# Patient Record
Sex: Female | Born: 1990 | Race: White | Hispanic: No | Marital: Single | Smoking: Former smoker
Health system: Southern US, Community
[De-identification: ages and names within clinical notes are randomized; demographics above are authoritative.]

## PROBLEM LIST (undated history)

## (undated) DIAGNOSIS — R51 Headache: Secondary | ICD-10-CM

## (undated) DIAGNOSIS — N200 Calculus of kidney: Secondary | ICD-10-CM

## (undated) DIAGNOSIS — J45909 Unspecified asthma, uncomplicated: Secondary | ICD-10-CM

## (undated) DIAGNOSIS — A499 Bacterial infection, unspecified: Secondary | ICD-10-CM

## (undated) DIAGNOSIS — O99340 Other mental disorders complicating pregnancy, unspecified trimester: Secondary | ICD-10-CM

## (undated) DIAGNOSIS — F319 Bipolar disorder, unspecified: Secondary | ICD-10-CM

## (undated) DIAGNOSIS — R519 Headache, unspecified: Secondary | ICD-10-CM

## (undated) DIAGNOSIS — K429 Umbilical hernia without obstruction or gangrene: Secondary | ICD-10-CM

## (undated) HISTORY — DX: Bacterial infection, unspecified: A49.9

## (undated) HISTORY — DX: Calculus of kidney: N20.0

## (undated) HISTORY — DX: Headache, unspecified: R51.9

## (undated) HISTORY — PX: HERNIA REPAIR: SHX51

## (undated) HISTORY — DX: Bipolar disorder, unspecified: F31.9

## (undated) HISTORY — DX: Headache: R51

## (undated) HISTORY — PX: WISDOM TOOTH EXTRACTION: SHX21

## (undated) HISTORY — PX: DILATION AND EVACUATION: SHX1459

## (undated) HISTORY — DX: Umbilical hernia without obstruction or gangrene: K42.9

## (undated) HISTORY — DX: Other mental disorders complicating pregnancy, unspecified trimester: O99.340

---
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
## 2009-07-03 ENCOUNTER — Ambulatory Visit (HOSPITAL_COMMUNITY): Admission: RE | Admit: 2009-07-03 | Discharge: 2009-07-03 | Payer: Self-pay | Admitting: Family Medicine

## 2009-07-24 ENCOUNTER — Ambulatory Visit (HOSPITAL_COMMUNITY): Admission: RE | Admit: 2009-07-24 | Discharge: 2009-07-24 | Payer: Self-pay | Admitting: Family Medicine

## 2009-08-01 ENCOUNTER — Ambulatory Visit (HOSPITAL_COMMUNITY): Admission: RE | Admit: 2009-08-01 | Discharge: 2009-08-01 | Payer: Self-pay | Admitting: Family Medicine

## 2009-08-08 ENCOUNTER — Ambulatory Visit (HOSPITAL_COMMUNITY): Admission: RE | Admit: 2009-08-08 | Discharge: 2009-08-08 | Payer: Self-pay | Admitting: Family Medicine

## 2009-09-05 ENCOUNTER — Ambulatory Visit (HOSPITAL_COMMUNITY): Admission: RE | Admit: 2009-09-05 | Discharge: 2009-09-05 | Payer: Self-pay | Admitting: Family Medicine

## 2009-10-09 ENCOUNTER — Ambulatory Visit (HOSPITAL_COMMUNITY): Admission: RE | Admit: 2009-10-09 | Discharge: 2009-10-09 | Payer: Self-pay | Admitting: Family Medicine

## 2009-11-06 ENCOUNTER — Ambulatory Visit (HOSPITAL_COMMUNITY): Admission: RE | Admit: 2009-11-06 | Discharge: 2009-11-06 | Payer: Self-pay | Admitting: Family Medicine

## 2009-12-04 ENCOUNTER — Ambulatory Visit (HOSPITAL_COMMUNITY)
Admission: RE | Admit: 2009-12-04 | Discharge: 2009-12-04 | Payer: Self-pay | Source: Home / Self Care | Admitting: Obstetrics and Gynecology

## 2010-01-06 ENCOUNTER — Inpatient Hospital Stay (HOSPITAL_COMMUNITY)
Admission: AD | Admit: 2010-01-06 | Discharge: 2010-01-06 | Payer: Self-pay | Source: Home / Self Care | Attending: Obstetrics and Gynecology | Admitting: Obstetrics and Gynecology

## 2010-01-07 ENCOUNTER — Inpatient Hospital Stay (HOSPITAL_COMMUNITY)
Admission: AD | Admit: 2010-01-07 | Discharge: 2010-01-09 | Payer: Self-pay | Source: Home / Self Care | Attending: Obstetrics and Gynecology | Admitting: Obstetrics and Gynecology

## 2010-01-07 ENCOUNTER — Encounter (INDEPENDENT_AMBULATORY_CARE_PROVIDER_SITE_OTHER): Payer: Self-pay | Admitting: Obstetrics and Gynecology

## 2010-01-09 LAB — URINALYSIS, MICROSCOPIC ONLY
Bilirubin Urine: NEGATIVE
Ketones, ur: NEGATIVE mg/dL
Nitrite: NEGATIVE
Protein, ur: NEGATIVE mg/dL
Specific Gravity, Urine: 1.02 (ref 1.005–1.030)
Urine Glucose, Fasting: NEGATIVE mg/dL
Urobilinogen, UA: 0.2 mg/dL (ref 0.0–1.0)
pH: 5.5 (ref 5.0–8.0)

## 2010-01-10 LAB — URINE CULTURE
Colony Count: 6000
Culture  Setup Time: 201201041735
Special Requests: NEGATIVE

## 2010-01-27 ENCOUNTER — Encounter: Payer: Self-pay | Admitting: Family Medicine

## 2010-02-01 ENCOUNTER — Inpatient Hospital Stay (HOSPITAL_COMMUNITY)
Admission: AD | Admit: 2010-02-01 | Discharge: 2010-02-01 | Payer: Self-pay | Source: Home / Self Care | Attending: Obstetrics and Gynecology | Admitting: Obstetrics and Gynecology

## 2010-02-01 LAB — DIFFERENTIAL
Basophils Absolute: 0 10*3/uL (ref 0.0–0.1)
Basophils Relative: 0 % (ref 0–1)
Eosinophils Absolute: 0.1 10*3/uL (ref 0.0–0.7)
Eosinophils Relative: 2 % (ref 0–5)
Lymphocytes Relative: 37 % (ref 12–46)
Lymphs Abs: 3.2 10*3/uL (ref 0.7–4.0)
Monocytes Absolute: 0.5 10*3/uL (ref 0.1–1.0)
Monocytes Relative: 5 % (ref 3–12)
Neutro Abs: 4.9 10*3/uL (ref 1.7–7.7)
Neutrophils Relative %: 56 % (ref 43–77)

## 2010-02-01 LAB — CBC
HCT: 33.2 % — ABNORMAL LOW (ref 36.0–46.0)
Hemoglobin: 11.2 g/dL — ABNORMAL LOW (ref 12.0–15.0)
MCH: 28.6 pg (ref 26.0–34.0)
MCHC: 33.7 g/dL (ref 30.0–36.0)
MCV: 84.9 fL (ref 78.0–100.0)
Platelets: 368 10*3/uL (ref 150–400)
RBC: 3.91 MIL/uL (ref 3.87–5.11)
RDW: 12 % (ref 11.5–15.5)
WBC: 8.7 10*3/uL (ref 4.0–10.5)

## 2010-02-01 LAB — COMPREHENSIVE METABOLIC PANEL
ALT: 18 U/L (ref 0–35)
AST: 17 U/L (ref 0–37)
Albumin: 3.9 g/dL (ref 3.5–5.2)
Alkaline Phosphatase: 80 U/L (ref 39–117)
BUN: 14 mg/dL (ref 6–23)
CO2: 28 mEq/L (ref 19–32)
Calcium: 9.6 mg/dL (ref 8.4–10.5)
Chloride: 105 mEq/L (ref 96–112)
Creatinine, Ser: 0.63 mg/dL (ref 0.4–1.2)
GFR calc Af Amer: >60 mL/min (ref >60)
GFR calc non Af Amer: >60 mL/min (ref >60)
Glucose, Bld: 93 mg/dL (ref 70–99)
Potassium: 4.2 mEq/L (ref 3.5–5.1)
Sodium: 139 mEq/L (ref 135–145)
Total Bilirubin: 0.3 mg/dL (ref 0.3–1.2)
Total Protein: 6.4 g/dL (ref 6.0–8.3)

## 2010-02-01 LAB — URINALYSIS, ROUTINE W REFLEX MICROSCOPIC
Bilirubin Urine: NEGATIVE
Ketones, ur: NEGATIVE mg/dL
Leukocytes, UA: NEGATIVE
Nitrite: NEGATIVE
Protein, ur: NEGATIVE mg/dL
Specific Gravity, Urine: >1.03 — ABNORMAL HIGH (ref 1.005–1.030)
Urine Glucose, Fasting: NEGATIVE mg/dL
Urobilinogen, UA: 0.2 mg/dL (ref 0.0–1.0)
pH: 6 (ref 5.0–8.0)

## 2010-02-01 LAB — URINE MICROSCOPIC-ADD ON

## 2010-02-01 LAB — HCG, QUANTITATIVE, PREGNANCY: hCG, Beta Chain, Quant, S: 4 m[IU]/mL (ref <5)

## 2010-02-02 LAB — GC/CHLAMYDIA PROBE AMP, GENITAL
Chlamydia, DNA Probe: NEGATIVE
GC Probe Amp, Genital: NEGATIVE

## 2010-02-03 LAB — URINE CULTURE
Colony Count: NO GROWTH
Culture  Setup Time: 201201280122

## 2010-02-07 ENCOUNTER — Other Ambulatory Visit: Payer: Self-pay | Admitting: Obstetrics and Gynecology

## 2010-02-07 ENCOUNTER — Ambulatory Visit (HOSPITAL_COMMUNITY)
Admission: RE | Admit: 2010-02-07 | Discharge: 2010-02-07 | Disposition: A | Discharge status: Home / Self Care | Payer: Medicaid Other | Source: Ambulatory Visit | Attending: Obstetrics and Gynecology | Admitting: Obstetrics and Gynecology

## 2010-02-07 DIAGNOSIS — N949 Unspecified condition associated with female genital organs and menstrual cycle: Secondary | ICD-10-CM | POA: Insufficient documentation

## 2010-02-07 DIAGNOSIS — N938 Other specified abnormal uterine and vaginal bleeding: Secondary | ICD-10-CM | POA: Insufficient documentation

## 2010-02-19 NOTE — Op Note (Signed)
[name] [name], HAEFNER NO.:  [id number]  MEDICAL RECORD NO.:  [id number]           PATIENT TYPE:  O  LOCATION:  [facility]                          FACILITY[facility]  [facility]  PHYSICIAN:  Janine Limbo, M.D.DATE OF BIRTH:  1990/12/11  DATE OF PROCEDURE:  02/07/2010 DATE OF DISCHARGE:                              OPERATIVE REPORT   PREOPERATIVE DIAGNOSES: 1. Heavy uterine bleeding. 2. Uterine cramping. 3. Retained products of conception.  POSTOPERATIVE DIAGNOSES: 1. Heavy uterine bleeding. 2. Uterine cramping. 3. Retained products of conception.  PROCEDURE:  Suction dilatation and evacuation.  SURGEON:  Janine Limbo, MD  FIRST ASSISTANT:  None.  ANESTHETIC:  General.  DISPOSITION:  [name] is a 20 year old female, now para 1-0-0-1, who presents with the above-mentioned diagnoses.  The patient had a vaginal delivery on January 07, 2010.  She has complained of heavy uterine bleeding and has been evaluated on multiple occasions.  She has been treated with Methergine on two separate occasions.  An ultrasound was performed that showed a 3-cm area within the uterine cavity that was suspicious for retained products of conception.  The patient also complains of crampy back pain.  The patient understands the indications for her surgical procedure and she accepts the risks of, but not limited to, anesthetic complications, bleeding, infections, and possible damage to the surrounding organs.  FINDINGS:  The patient's blood type is AB positive.  Her hemoglobin is 11.8.  The patient had a 14-week size uterus.  There was a small amount of products of conception within the uterine cavity.  No adnexal masses were appreciated.  PROCEDURE:  The patient was taken to the operating room where a general anesthetic was given.  The patient's lower abdomen, perineum, and vagina were prepped with multiple layers of Betadine.  The bladder was drained of urine.  The patient was  then sterilely draped.  Examination under anesthesia was performed.  A paracervical block was placed using 10 mL of 0.5% Marcaine.  The uterus sounded to 14-cm.  The cervix was gently dilated.  The uterine cavity was evacuated using a size 10 suction curette followed by medium sharp curette.  A moderate amount of blood clots and a small amount of products of conception were removed within the uterine cavity.  No other pathology was appreciated.  The cavity was felt to be clean at the end of our procedure.  Hemostasis was confirmed. The patient's exam was repeated and the uterus was noted to be firm. The patient was returned to the supine position.  She was awakened from her anesthetic without difficulty and then transported to the recovery room in stable condition.  The products of conception were sent to Pathology.  Sponge and needle counts were correct.  The estimated blood loss for the procedure was 20 mL.  FOLLOWUP INSTRUCTIONS:  The patient will return to see Dr. Stefano Gaul in 2 weeks for followup examination.  She was given a copy of thepostoperative instruction sheet as prepared by the [facility] for patient's who have undergone a dilatation and curettage. She will call for questions or concerns.  She was given a prescription  for: 1. Motrin 800 mg 1 tablet every 8 h. as needed for mild-to-moderate     pain. 2. Vicodin 1 or 2 tablets every 4 h. as needed for severe pain. 3. Phenergan 25 mg 1 tablet every 6 h. as needed for nausea.     Janine Limbo, M.D.     AVS/MEDQ  D:  02/07/2010  T:  02/08/2010  Job:  [id number]  Electronically Signed by Kirkland Hun M.D. on 02/12/2010 10:07:52 PM

## 2010-03-18 LAB — URIC ACID: Uric Acid, Serum: 7.7 mg/dL — ABNORMAL HIGH (ref 2.4–7.0)

## 2010-03-18 LAB — CBC
HCT: 32.7 % — ABNORMAL LOW (ref 36.0–46.0)
HCT: 32.7 % — ABNORMAL LOW (ref 36.0–46.0)
Hemoglobin: 11.7 g/dL — ABNORMAL LOW (ref 12.0–15.0)
MCH: 29.3 pg (ref 26.0–34.0)
MCV: 83 fL (ref 78.0–100.0)
MCV: 87.2 fL (ref 78.0–100.0)
Platelets: 220 10*3/uL (ref 150–400)
RBC: 3.75 MIL/uL — ABNORMAL LOW (ref 3.87–5.11)
RDW: 12.6 % (ref 11.5–15.5)
WBC: 14.5 10*3/uL — ABNORMAL HIGH (ref 4.0–10.5)

## 2010-03-18 LAB — COMPREHENSIVE METABOLIC PANEL
Alkaline Phosphatase: 104 U/L (ref 39–117)
BUN: 9 mg/dL (ref 6–23)
CO2: 27 mEq/L (ref 19–32)
Chloride: 101 mEq/L (ref 96–112)
Creatinine, Ser: 0.63 mg/dL (ref 0.4–1.2)
GFR calc non Af Amer: >60 mL/min (ref >60)
Glucose, Bld: 69 mg/dL — ABNORMAL LOW (ref 70–99)
Potassium: 3.8 mEq/L (ref 3.5–5.1)
Total Bilirubin: 0.6 mg/dL (ref 0.3–1.2)

## 2010-03-18 LAB — LACTATE DEHYDROGENASE: LDH: 164 U/L (ref 94–250)

## 2010-05-24 NOTE — Op Note (Signed)
[name] [name]            ACCOUNT NO.:  [id number]   MEDICAL RECORD NO.:  [id number]          PATIENT TYPE:  AMB   LOCATION:  [facility]                          FACILITY:  [facility]   PHYSICIAN:  Hewitt Blade, D.D.S.DATE OF BIRTH:  [birth year]   DATE OF PROCEDURE:  03/03/2004  DATE OF DISCHARGE:  02/28/2004                                 OPERATIVE REPORT   PREOPERATIVE DIAGNOSIS:  Maxillary and mandibular full bony impacted third  molar teeth, numbers 1, 16, 17, 32, full bony and aberrantly displaced  impacted maxillary canine tooth number 11, history of asthma, history of  anxiety disorders.   POSTOPERATIVE DIAGNOSIS:  Maxillary and mandibular full bony impacted third  molar teeth, numbers 1, 16, 17, 32, full bony and aberrantly displaced  impacted maxillary canine tooth number 11, history of asthma, history of  anxiety disorders.   OPERATION PERFORMED:  Removal of the above teeth.   SURGEON:  Hewitt Blade, D.D.S.   FIRST ASSISTANT:  Brewer.   ANESTHESIA:  General via oral endotracheal intubation.   ESTIMATED BLOOD LOSS:  Less than 25 mL.   FLUIDS REPLACED:  Approximately 1000 mL crystalloid solutions.   COMPLICATIONS:  None apparent.   INDICATIONS FOR PROCEDURE:  [name] [name] is a 20 year old who was referred to  my office by her orthodontist for evaluation and removal of the impacted  teeth.  The third molar teeth were routine fully bony impacted third molar  teeth.  The tooth #11 was located in an area that its removal would block  the patient's airway.  In view of the history of asthma, and anxiety  disorders, it was recommended that this procedure be performed in a hospital  setting under general anesthesia with intubation.   DESCRIPTION OF PROCEDURE:  On February 28, 2004, [name] [name] was taken to  [facility] where she was placed on the operating table in  a supine position.  Following successful oral endotracheal intubation  general  anesthesia, the patient's face, neck and oral cavity were prepped  and draped in the usual sterile operating room fashion.  The hypopharynx was  suctioned free of fluids and secretions, then a moistened two inch vaginal  pack was placed as a throat pack.  Attention was directed intraorally, where  approximately 12 mL of 0.5% Xylocaine containing 1:200,000 epinephrine were  infiltrated in the maxillary right and left posterior superior alveolar  nerve distributions, the right and left inferior alveolar nerve  distributions, and the anterior maxillary buccal and palatal soft tissues.  Attention was directed towards the right posterior mandibular arch, where a  #15 Bard-Parker blade was used to create a 1.5 curvilinear incision over the  alveolus of tooth #1.  A full thickness mucoperiosteal flap was elevated  laterally and superiorly using a #9 Molt periosteal elevator.  The overlying  cortical bone was identified and then reduced using a Stryker rotatory  osteotome with a #8 round bur.  The imbedded tooth was visualized and then  sectioned in its vertical axes using the Stryker rotatory osteotome and a  701 fissure bur.  The tooth was then subluxated and  removed from the  alveolus using an 11A elevator and curved Nicholaus Bloom hemostats.  The surrounding  dental follicular tissue was then curetted and removed using a double ended  Molt curette and removed.  The bony margins were then smoothed with a small  osseous file.  The surgical area was then thoroughly irrigated with sterile  saline irrigating solutions and suctioned.   In a similar fashion, the remaining third molar teeth were removed as well.  Attention was then directed towards the maxillary arch, where a full  thickness mucoperiosteal incision was made along the lingual aspects of  teeth numbers 8, 9, 10, 12, and 13.  A #9 Molt periosteal elevator was then  used to reflect a full thickness mucoperiosteal flap palatally.  The bone  over  imbedded tooth #11 was then reduced using a Stryker rotary osteotome  and a #8 round bur.  The tooth was then visualized and found to be displaced  between teeth numbers 10 and 12.  The tooth was then sectioned in its long  and vertical axes using the Stryker rotary osteotome and a 701 fissure bur.  The fragments of tooth were then subluxated and elevated from the palate  using an 11A elevator, a periosteal elevator, and removed using mosquito  hemostats.  The surrounding dental follicular tissue was curetted and  removed using the double ended Molt curette.  The area was then thoroughly  irrigated with sterile saline irrigating solutions and suctioned.  The  mucoperiosteal margins were proximally sutured in interrupted fashion in a  watertight manner closing the incision.  The oral cavity was then irrigated  and suctioned.  The throat pack was removed and the hypopharynx suctioned  free of fluids and secretions.  [name] [name] was then allowed to awaken from  the anesthesia and taken to the recovery room where she tolerated the  procedure well and without apparent complications.      DC/MEDQ  D:  03/03/2004  T:  03/04/2004  Job:  [id number]

## 2010-12-28 IMAGING — US US RENAL
1 series · 14 of 25 positions shown · non-contrast
Comparison: None.

CLINICAL DATA: Hematuria.  Nephrolithiasis.  17 weeks pregnant.

RENAL/URINARY TRACT ULTRASOUND COMPLETE

[Series 1: us renal · 0.26mm/px · 14 of 32 slices shown]
[im 1/32]
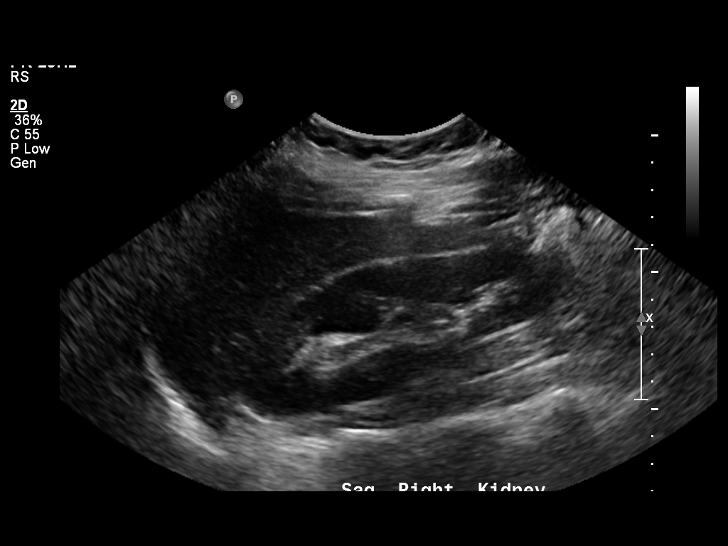
[im 3/32]
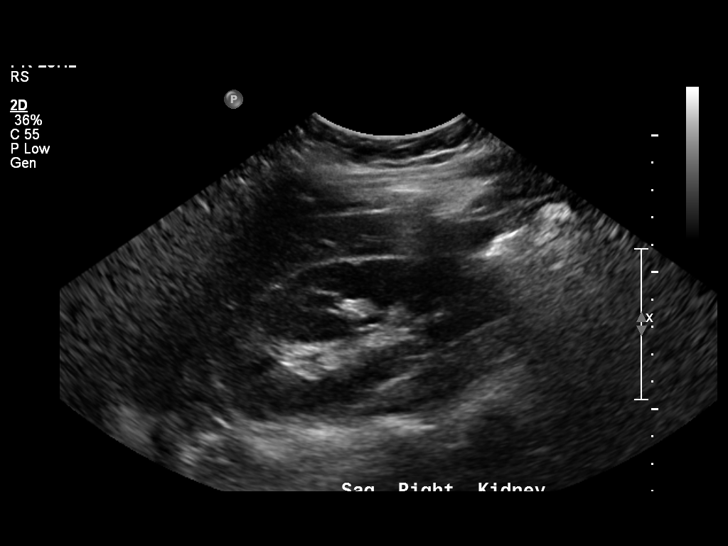
[im 6/32]
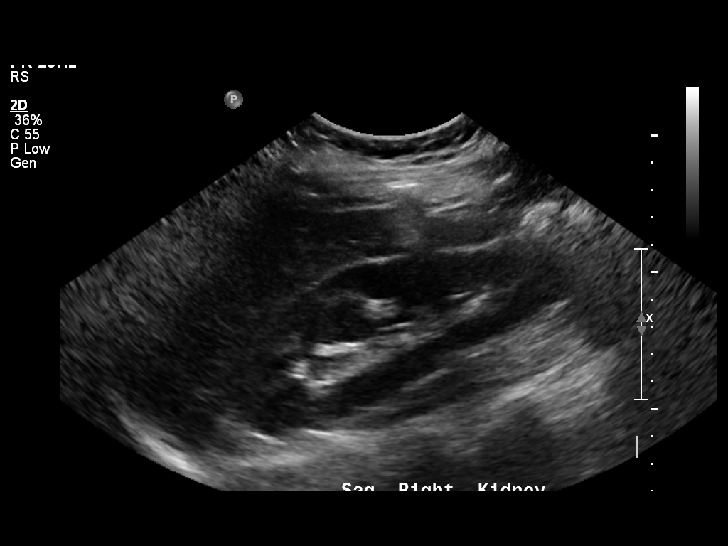
[im 8/32]
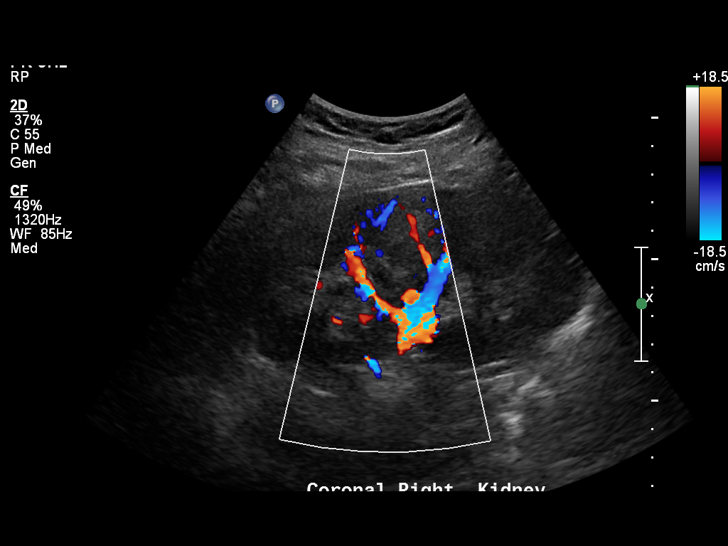
[im 11/32]
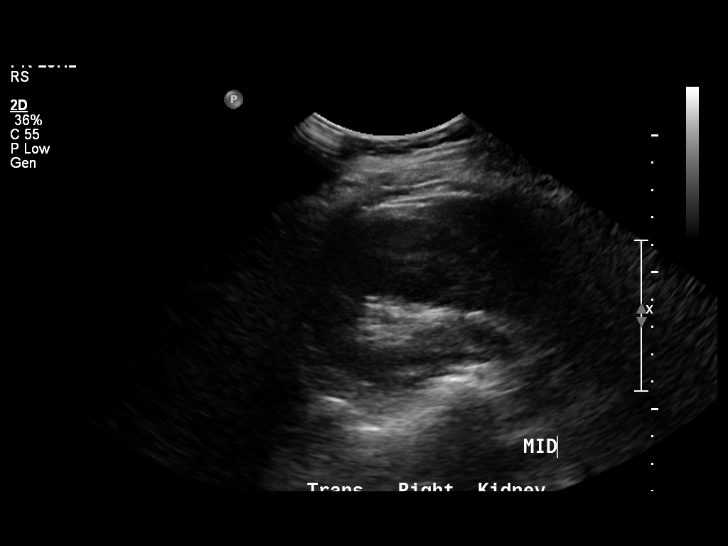
[im 12/32]
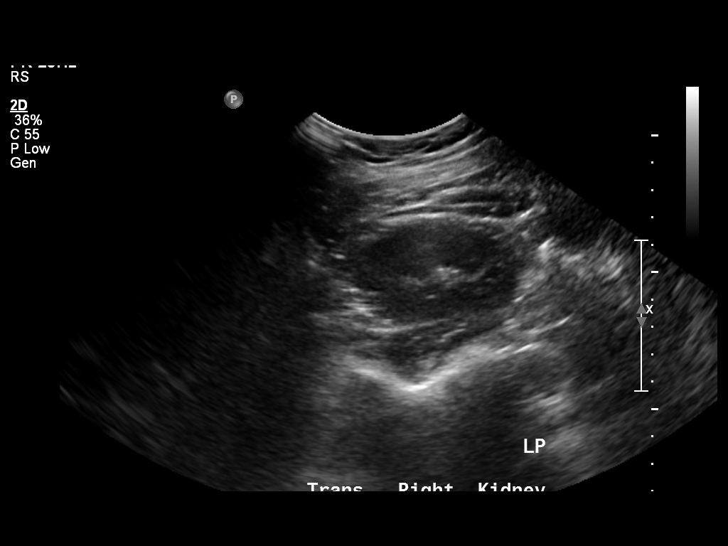
[im 15/32]
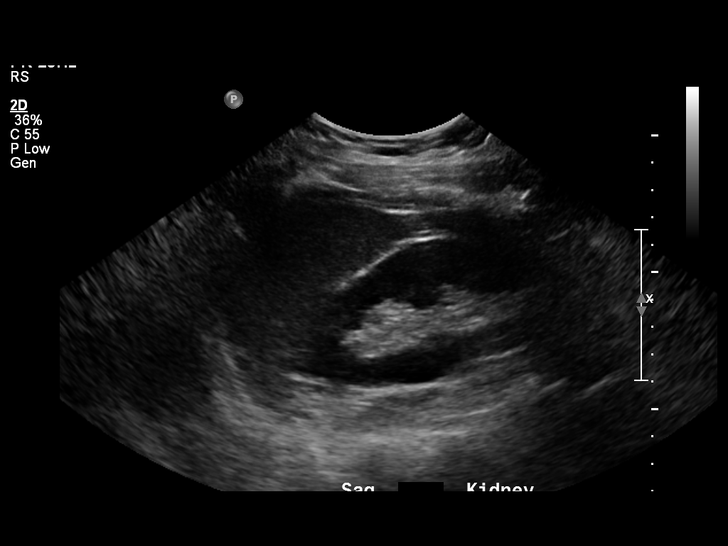
[im 17/32]
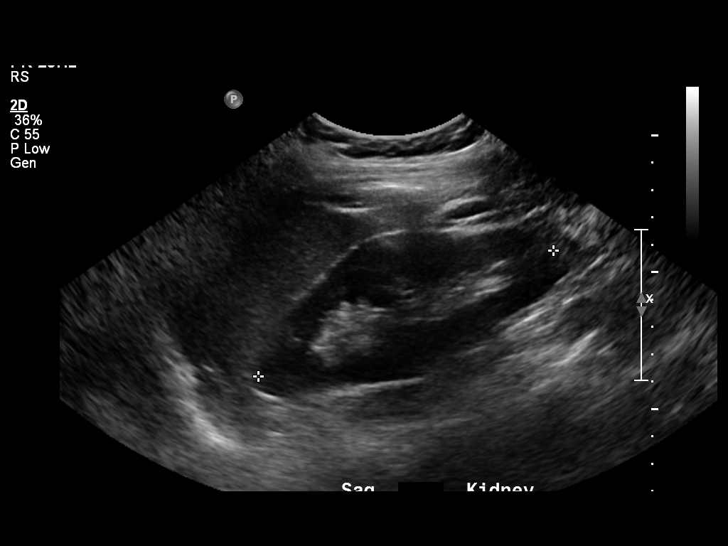
[im 20/32]
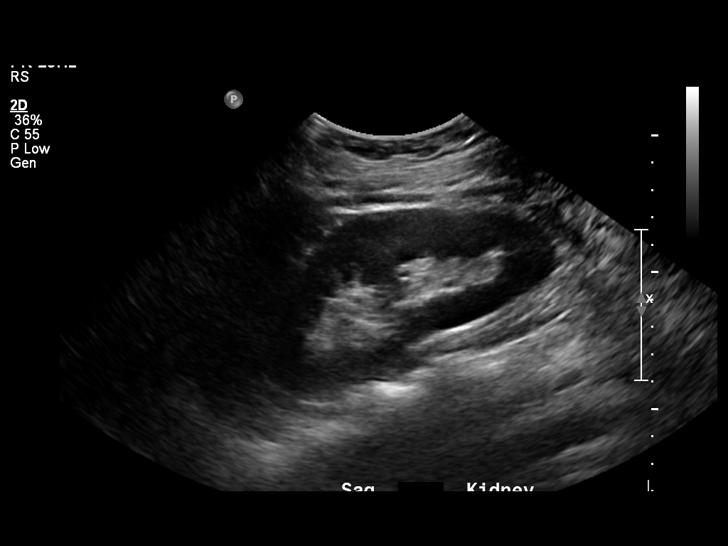
[im 21/32]
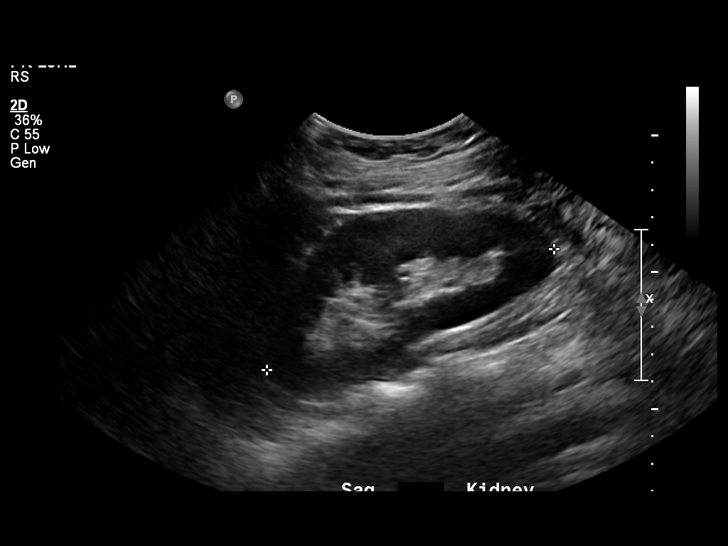
[im 24/32]
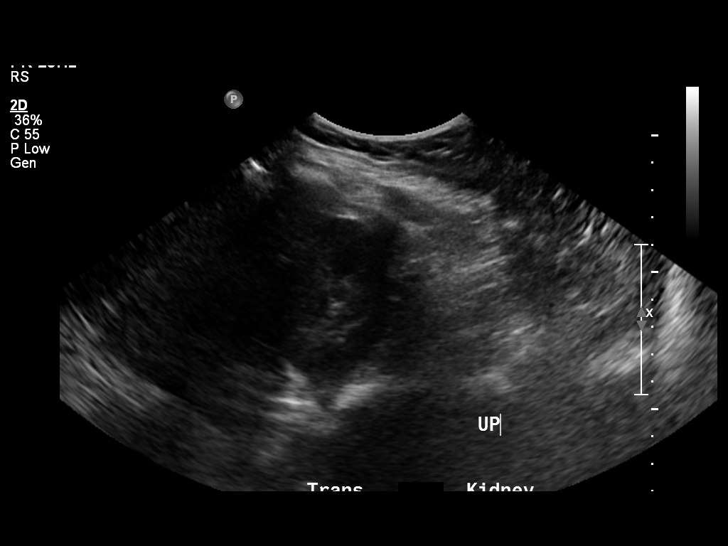
[im 26/32]
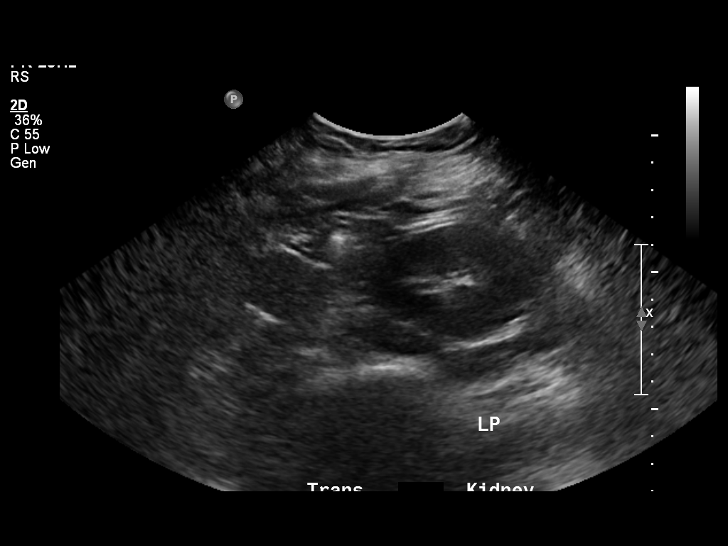
[im 29/32]
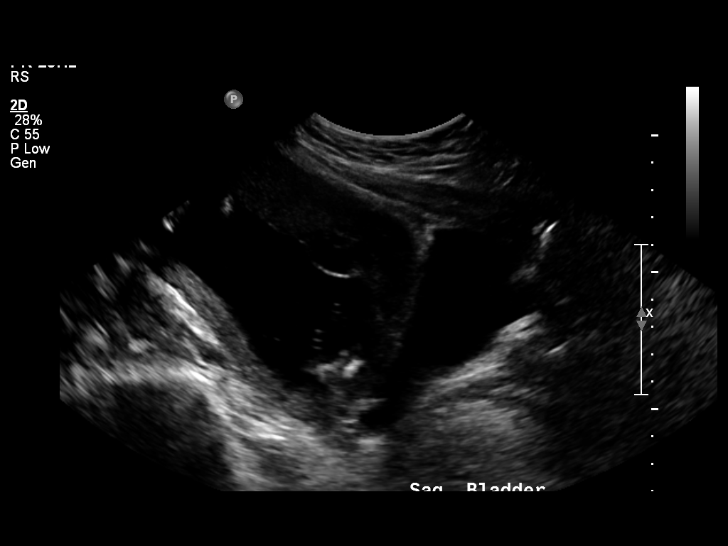
[im 32/32]
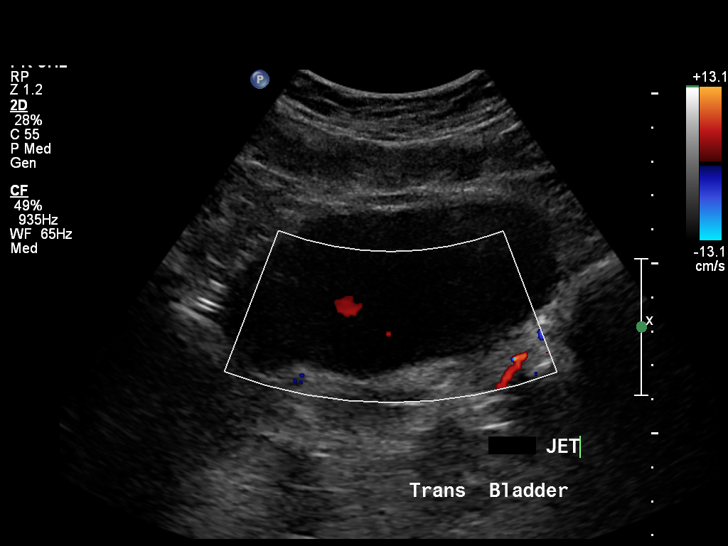

[14 of 25 positions shown; findings below may reference images not displayed]

FINDINGS: Right Kidney:  Normal in size and parenchymal echogenicity.  No
evidence of mass or hydronephrosis.  No definite renal calculi
visualized sonographically, although ultrasound sensitivity for
small calculi is decreased.

Left Kidney:  Normal in size and parenchymal echogenicity.  No
evidence of mass or hydronephrosis. No definite calculi are
visualized sonographically, although ultrasound sensitivity for
small calculi is decreased.

Bladder:  Appears normal for degree of bladder distention.
IMPRESSION: Normal study.  No evidence of renal mass or hydronephrosis.

## 2011-02-01 IMAGING — US US OB FOLLOW-UP
1 series · 18 of 28 positions shown · non-contrast
Comparison: none

OBSTETRICAL ULTRASOUND:
 This ultrasound was performed in The [HOSPITAL], and the [facility] report will be stored to [REDACTED] PACS.  This report is also available in [HOSPITAL]?s accessANYware.

[Series 1: us ob follow-up · 18 of 83 slices shown]
[im 1/83]
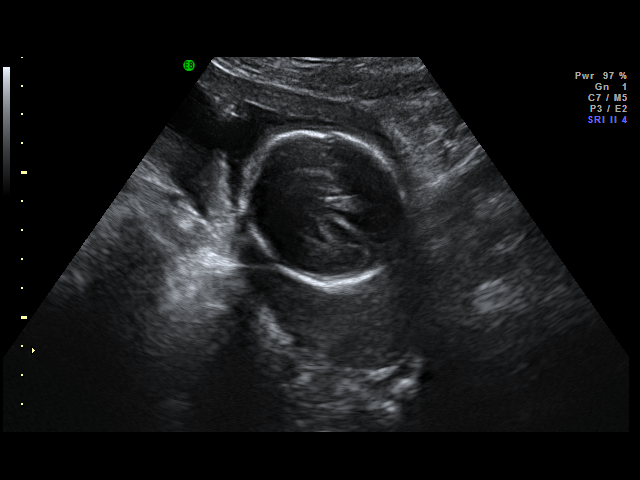
[im 7/83]
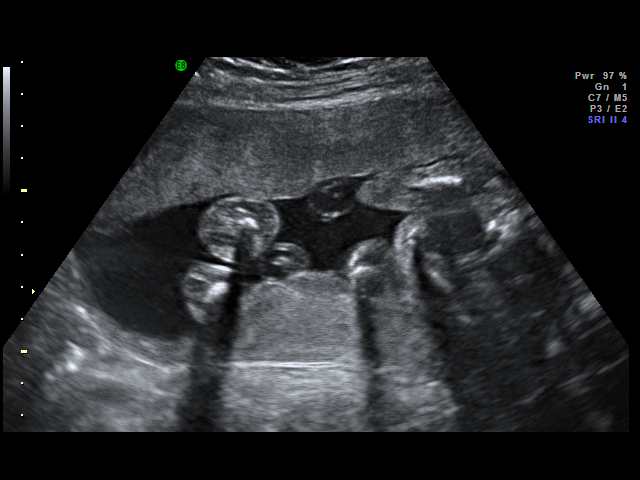
[im 10/83]
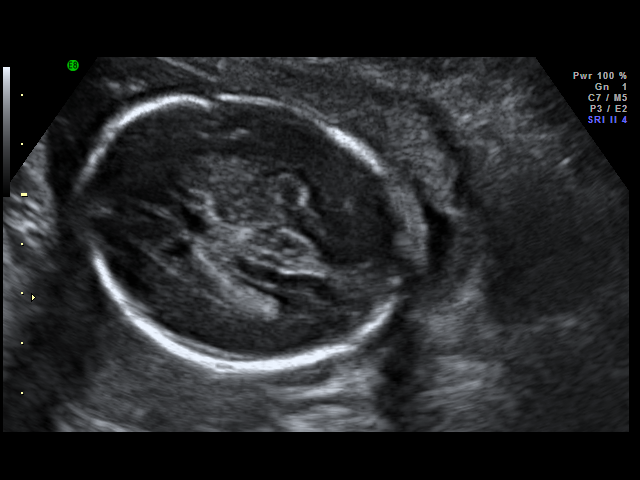
[im 16/83]
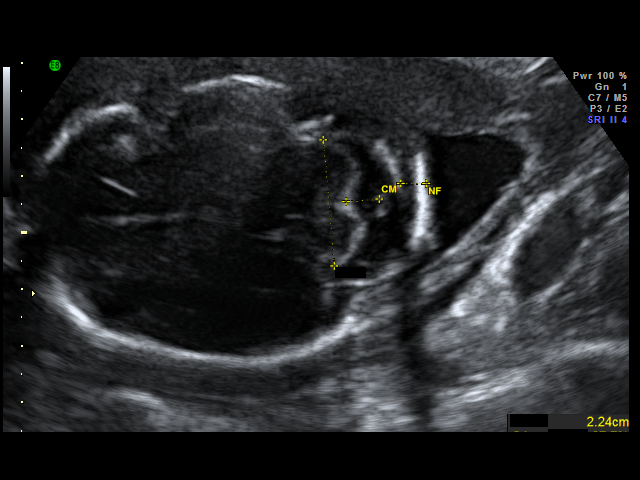
[im 22/83]
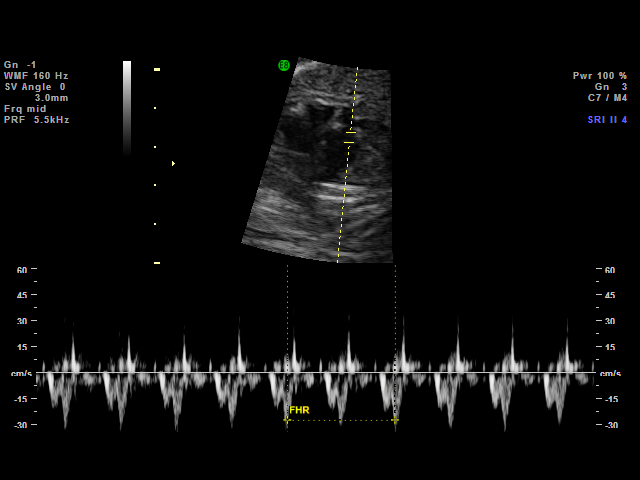
[im 25/83]
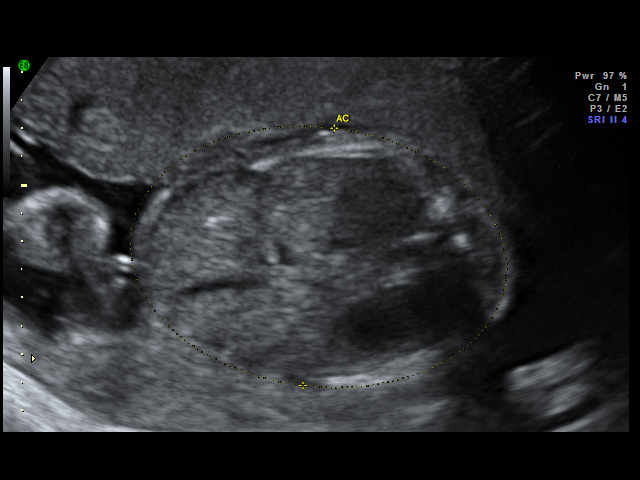
[im 31/83]
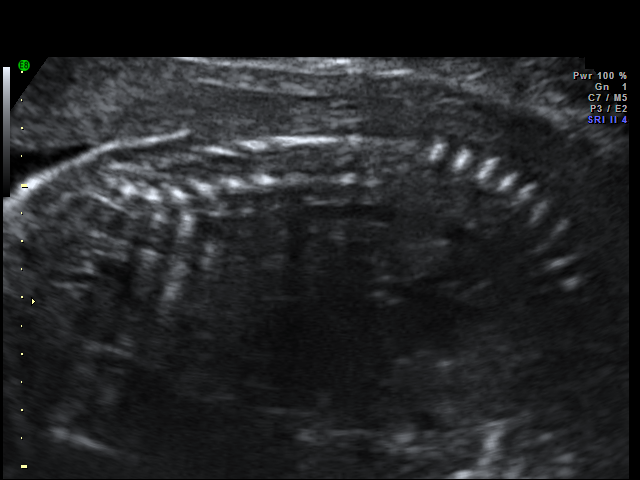
[im 34/83]
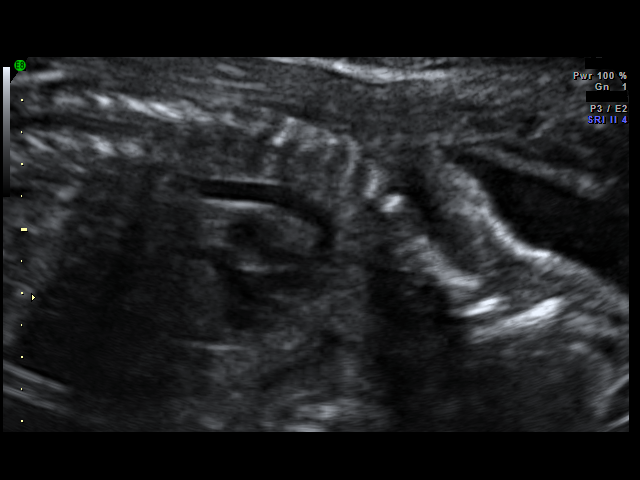
[im 40/83]
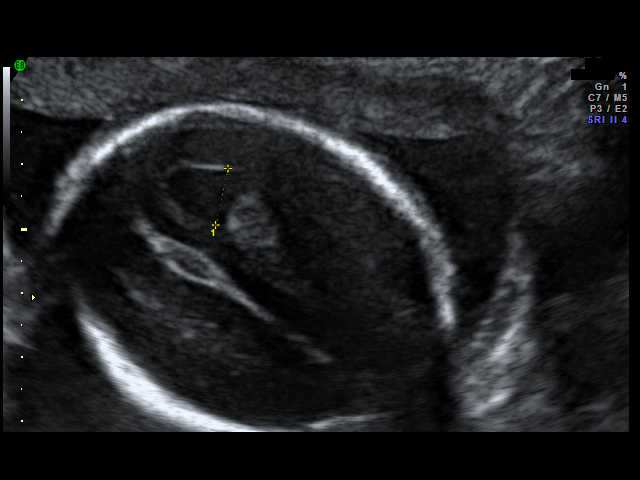
[im 43/83]
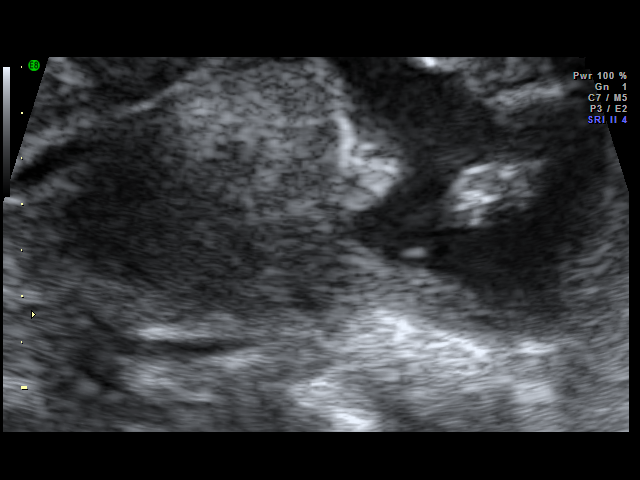
[im 49/83]
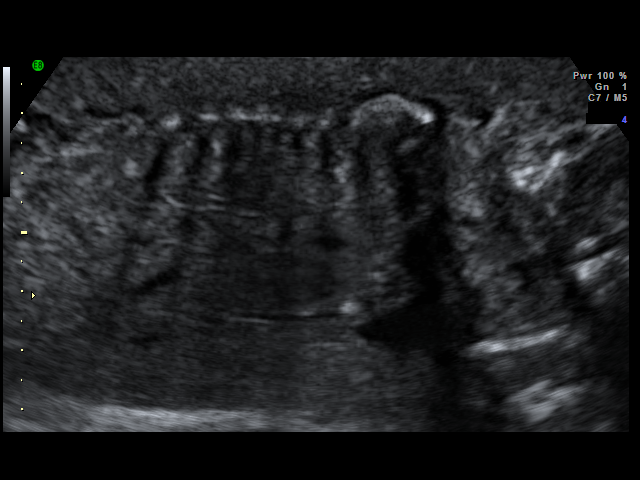
[im 52/83]
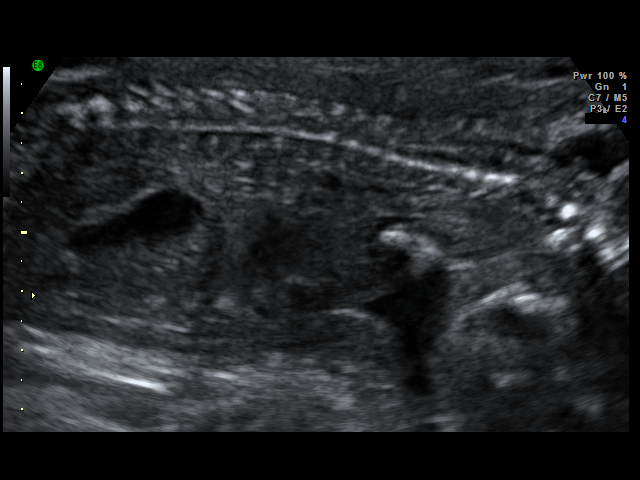
[im 58/83]
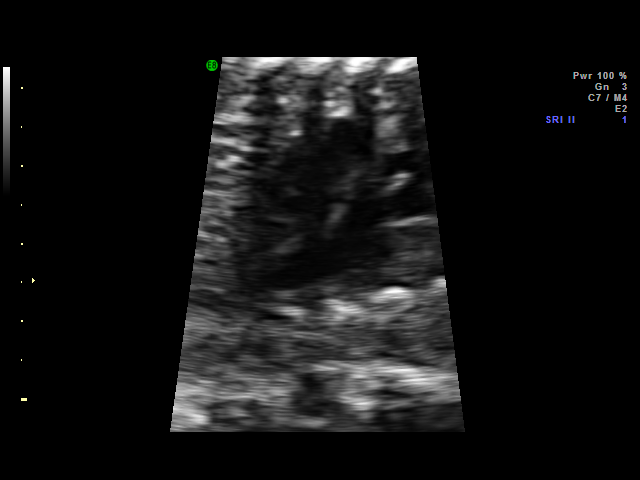
[im 64/83]
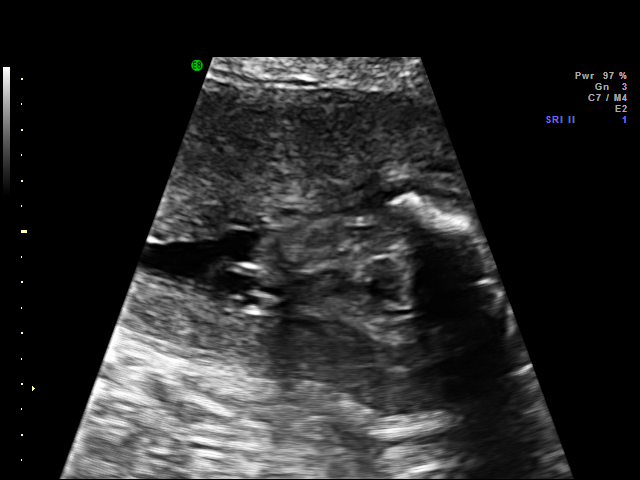
[im 67/83]
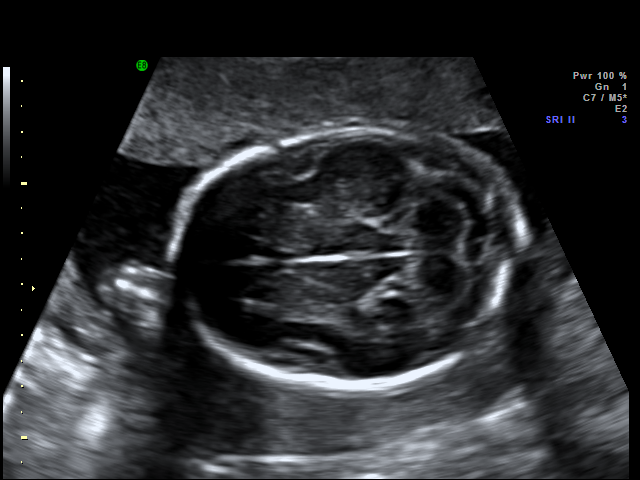
[im 73/83]
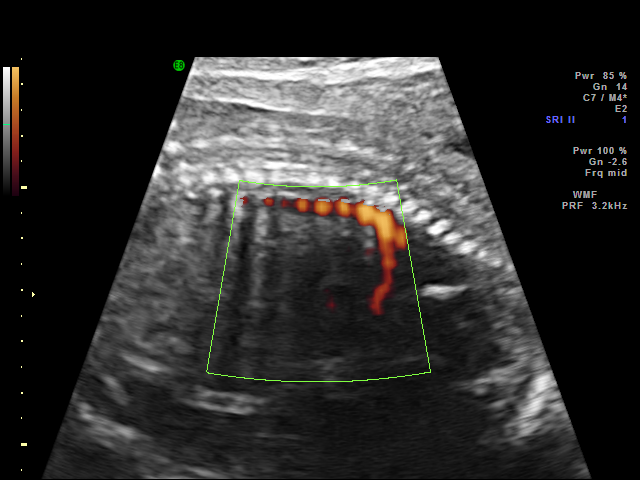
[im 76/83]
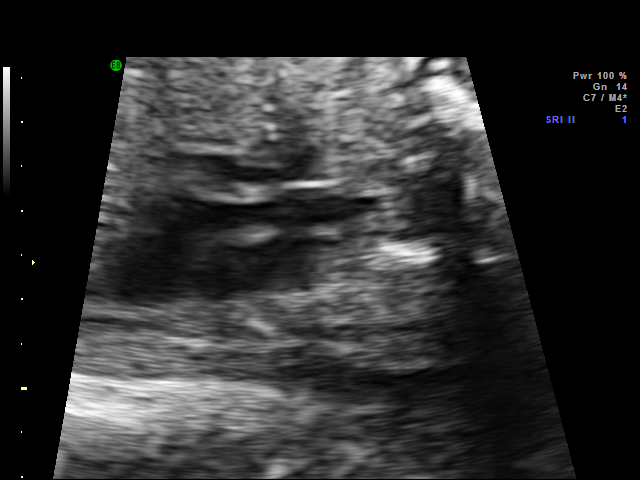
[im 83/83]
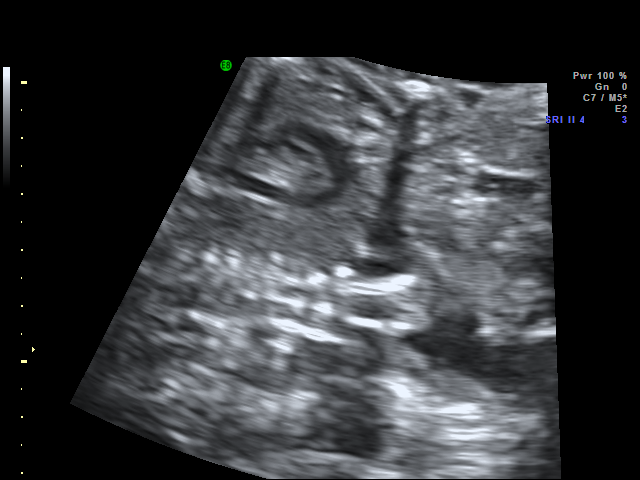

[18 of 28 positions shown; findings below may reference images not displayed]

IMPRESSION: [facility] has also been faxed to the ordering physician.

## 2011-06-30 IMAGING — US US PELVIS COMPLETE MODIFY
1 series · 14 of 25 positions shown · non-contrast
Comparison: None.

CLINICAL DATA: Abdominal pain and bleeding.



[Series 1: us pelvis complete · 14 of 46 slices shown]
[im 1/46]
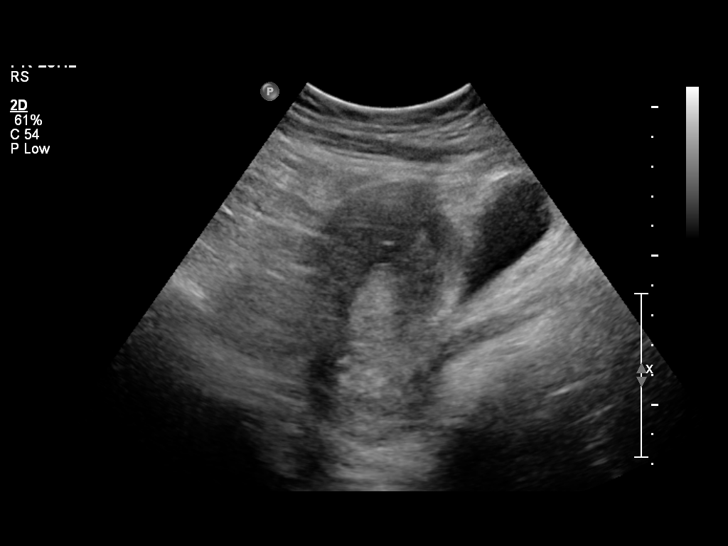
[im 4/46]
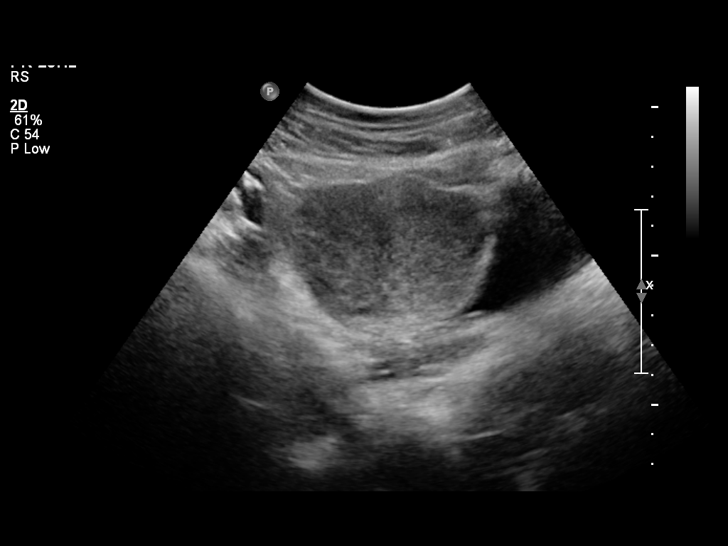
[im 8/46]
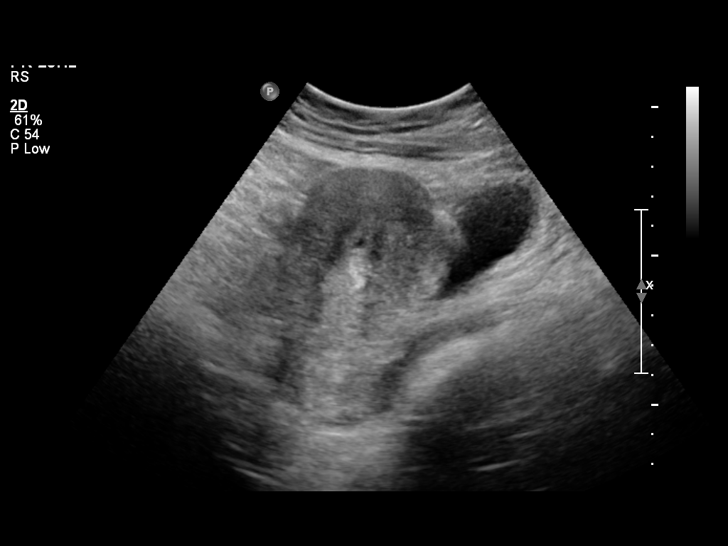
[im 12/46]
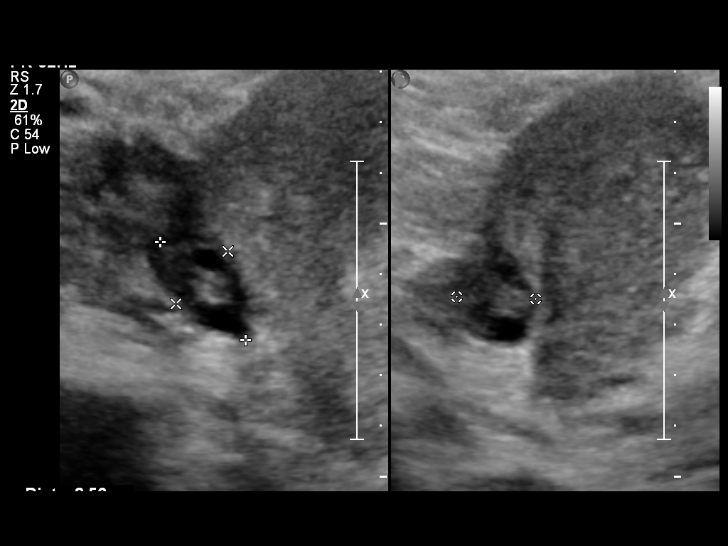
[im 16/46]
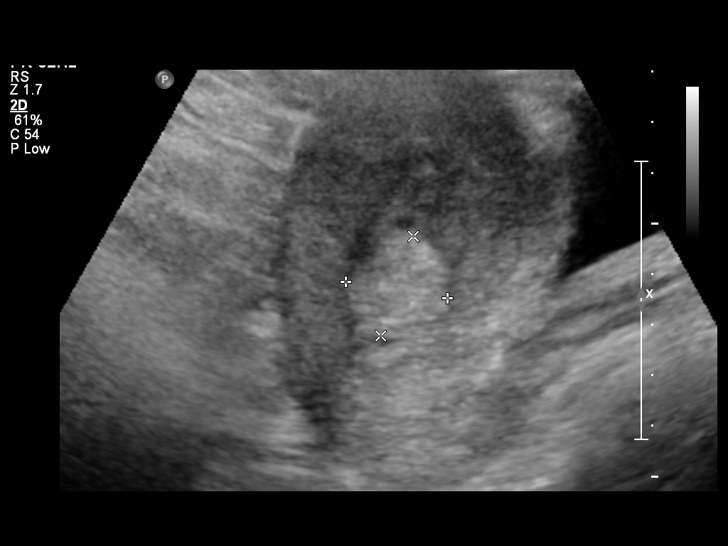
[im 17/46]
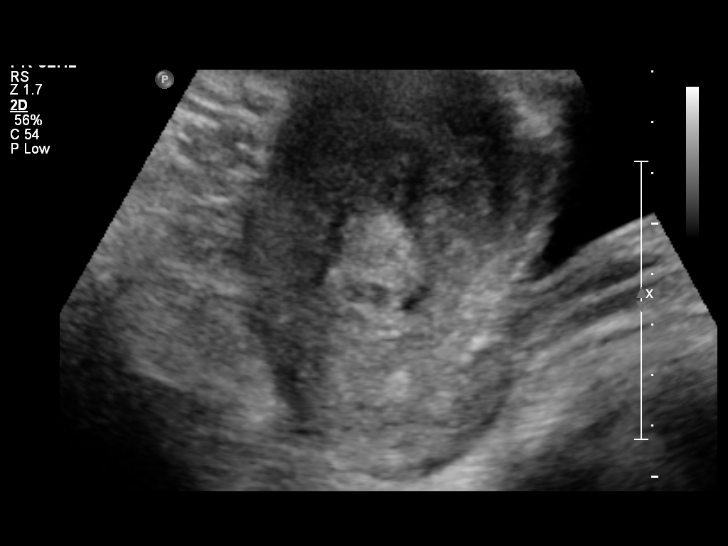
[im 21/46]
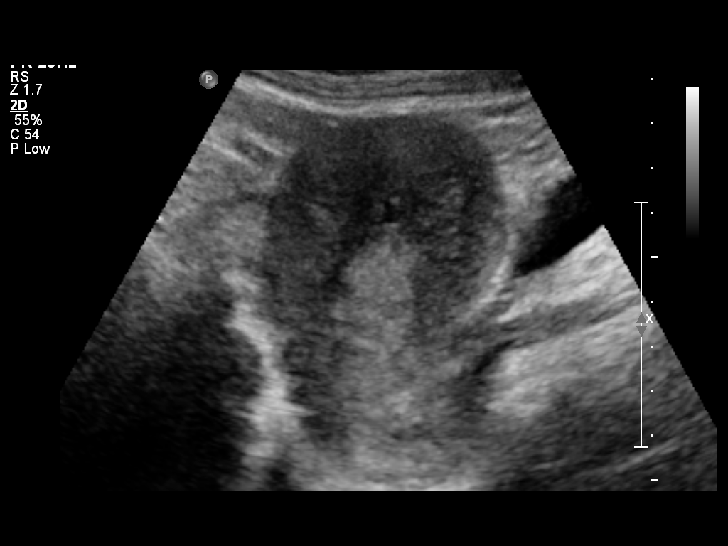
[im 25/46]
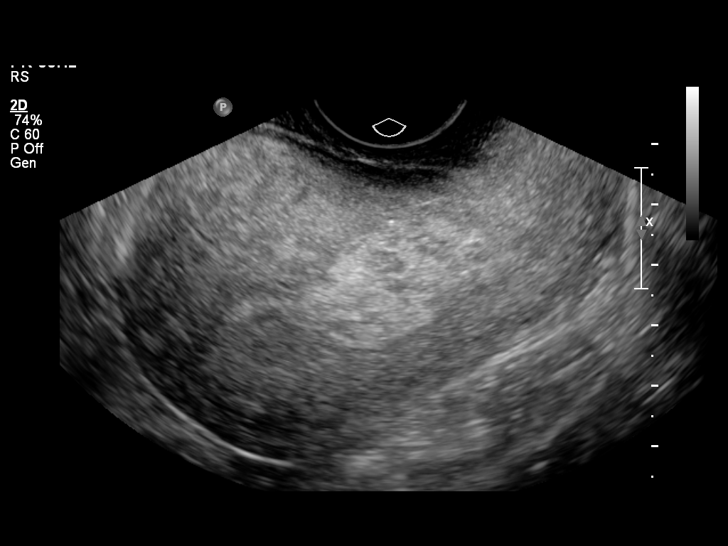
[im 29/46]
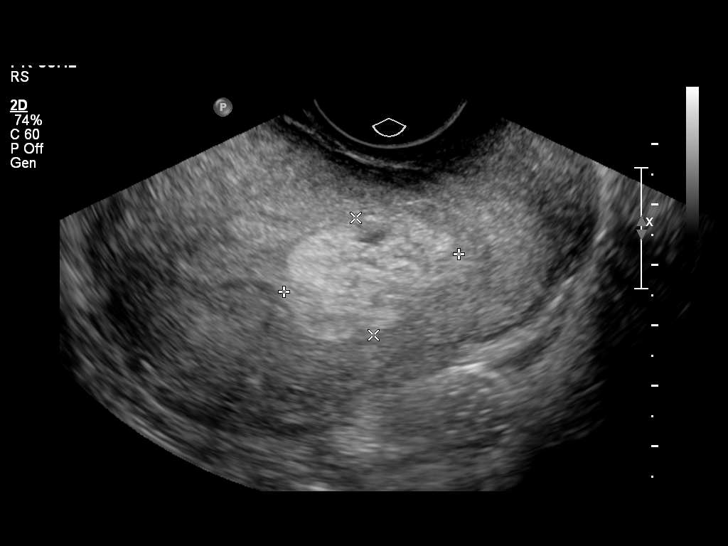
[im 31/46]
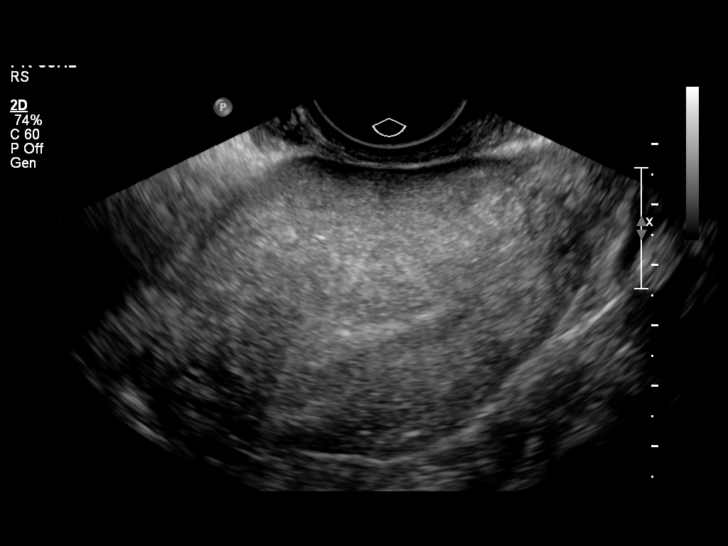
[im 34/46]
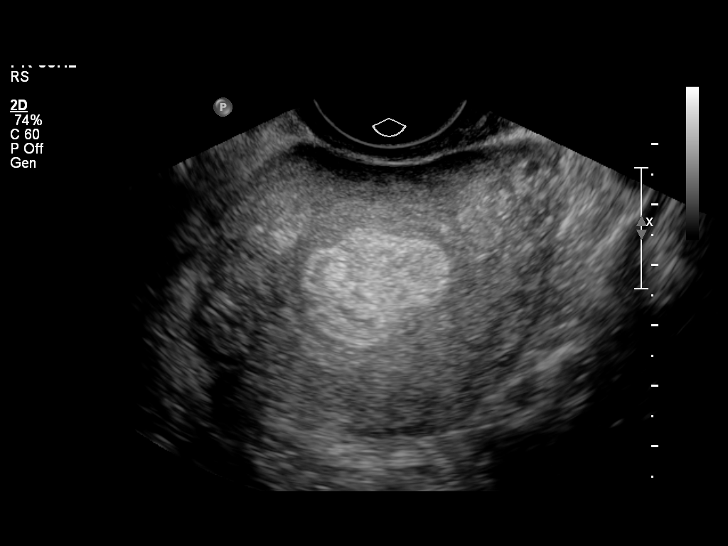
[im 38/46]
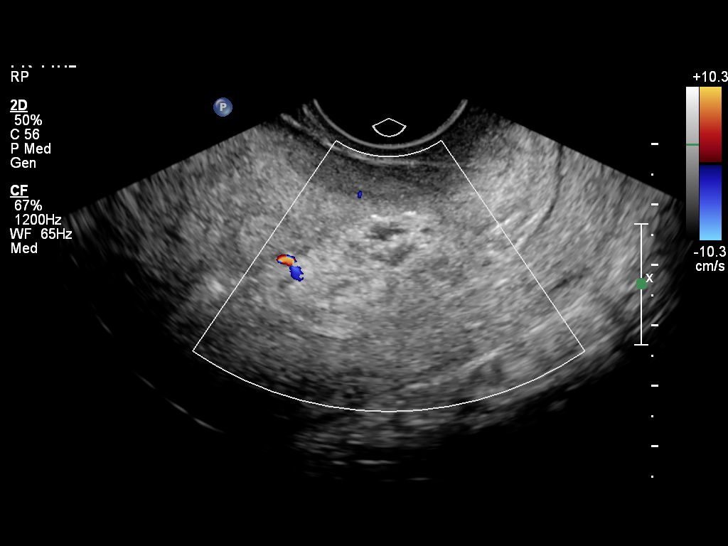
[im 42/46]
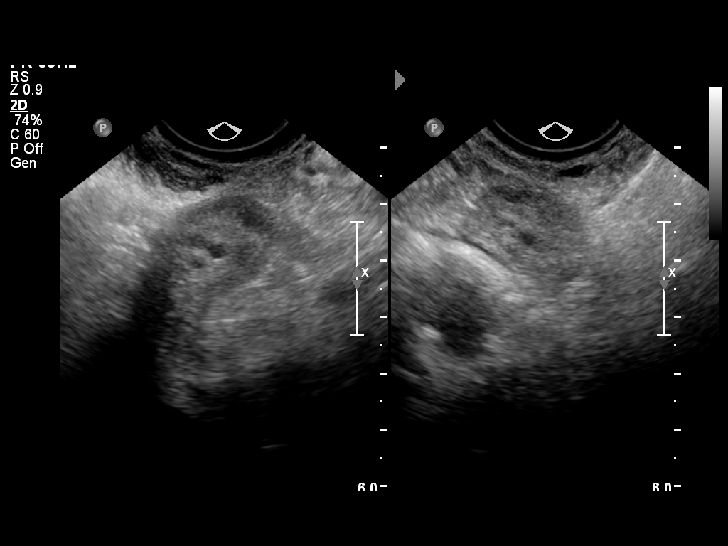
[im 46/46]
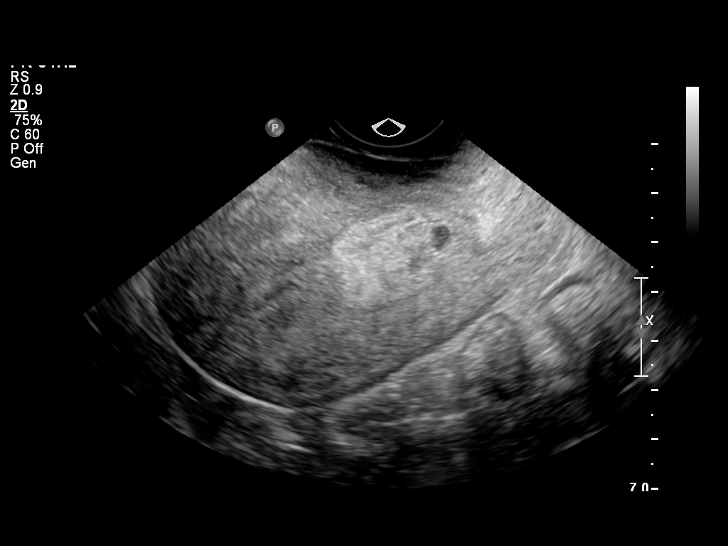

[14 of 25 positions shown; findings below may reference images not displayed]

FINDINGS: Uterus 8.6 x 5.2 x 6.3 cm within normal limits considering the the
patient is 3 weeks postpartum.

Endometrium heterogeneously echogenic material seen in the mid body
of the uterus measuring up to 3.0 cm.  Some vascular flow is seen
in this area.  The remainder the endometrium is within normal
limits.

Right Ovary 2.8 x 1.7 x 1.9 cm.  Normal.

Left Ovary 2.7 x 1.6 x 2.0 cm.  Normal.

Other Findings:  No free fluid is seen in the pelvis.
IMPRESSION: Focal  area of increased echogenicity in the endometrial canal with
some vascular flow suggestive of retained products of conception.

## 2012-03-11 ENCOUNTER — Telehealth: Payer: Self-pay | Admitting: Obstetrics and Gynecology

## 2012-03-11 NOTE — Telephone Encounter (Signed)
Lm on vm for pt to call back.

## 2012-03-12 ENCOUNTER — Ambulatory Visit: Payer: No Typology Code available for payment source | Admitting: Obstetrics and Gynecology

## 2012-03-12 ENCOUNTER — Encounter: Payer: Self-pay | Admitting: Obstetrics and Gynecology

## 2012-03-12 VITALS — BP 122/68 | Temp 99.1°F | Ht 66.0 in | Wt 198.0 lb

## 2012-03-12 DIAGNOSIS — Z202 Contact with and (suspected) exposure to infections with a predominantly sexual mode of transmission: Secondary | ICD-10-CM

## 2012-03-12 DIAGNOSIS — R319 Hematuria, unspecified: Secondary | ICD-10-CM

## 2012-03-12 DIAGNOSIS — R3911 Hesitancy of micturition: Secondary | ICD-10-CM

## 2012-03-12 DIAGNOSIS — R102 Pelvic and perineal pain: Secondary | ICD-10-CM

## 2012-03-12 DIAGNOSIS — N760 Acute vaginitis: Secondary | ICD-10-CM

## 2012-03-12 DIAGNOSIS — B9689 Other specified bacterial agents as the cause of diseases classified elsewhere: Secondary | ICD-10-CM

## 2012-03-12 DIAGNOSIS — Z975 Presence of (intrauterine) contraceptive device: Secondary | ICD-10-CM | POA: Insufficient documentation

## 2012-03-12 DIAGNOSIS — K429 Umbilical hernia without obstruction or gangrene: Secondary | ICD-10-CM | POA: Insufficient documentation

## 2012-03-12 DIAGNOSIS — R109 Unspecified abdominal pain: Secondary | ICD-10-CM

## 2012-03-12 LAB — POCT URINALYSIS DIPSTICK
Bilirubin, UA: NEGATIVE
Ketones, UA: NEGATIVE
Leukocytes, UA: NEGATIVE
Spec Grav, UA: 1.015
pH, UA: 5

## 2012-03-12 LAB — POCT WET PREP (WET MOUNT): Whiff Test: POSITIVE

## 2012-03-12 MED ORDER — METRONIDAZOLE 500 MG PO TABS: 500.0000 mg | ORAL_TABLET | Freq: Three times a day (TID) | ORAL | Status: DC

## 2012-03-12 MED ORDER — AZITHROMYCIN 500 MG PO TABS: 500.0000 mg | ORAL_TABLET | Freq: Every day | ORAL | Status: DC

## 2012-03-12 MED ORDER — FLUCONAZOLE 150 MG PO TABS: 150.0000 mg | ORAL_TABLET | Freq: Once | ORAL | Status: DC

## 2012-03-12 MED ORDER — HYDROCODONE-ACETAMINOPHEN 5-300 MG PO TABS: 1.0000 | ORAL_TABLET | Freq: Four times a day (QID) | ORAL | Status: DC | PRN

## 2012-03-12 NOTE — Progress Notes (Signed)
Vag. Discharge:yes Odor:yes Fever:yes Irreg.Periods:no Dyspareunia:no Dysuria:no Frequency:no Urgency:no Hematuria:no Kidney stones:no Constipation:no Diarrhea:no Rectal Bleeding: no Vomiting:no Nausea:no Pregnant:no Fibroids:no Endometriosis:no Hx of Ovarian Cyst:yes Hx IUD:yes Hx STD-PID:no Appendectomy:no Gall Bladder Dz:no Pt request to be tested for GC/CT. States that she also believes she may have BV

## 2012-03-12 NOTE — Progress Notes (Signed)
22 YO with a Mirena IUD (since 02/28/2010) ,  complains of abdominal pain and abnormal vaginal discharge with an odor.   Would like to be tested for [facility] & CT because her boyfriend was diagnosed with chlamydia.  Pelvic pain is sharp to dull, off & on and is made worse with menses, if bowels are bloated, umbilical hernia is bothering her or sometimes sudden movements.. The dull pain is "always" there but the sharp is random and may last up to 3 days.  Rates the worse pain as 10/10 is decreased with Ibuprofen 400 mg to 7/10.  Recently had a respiratory infection with fever up to 101 degrees F but is now on antibiotics (Amoxicillin 500 mg tid).  During that time she had some coughing that worsened her umbilical hernia and subsequently her pelvic pain.  Denies vomiting, diarrhea, constipation, dysuria but admits to mild hesitancy (since she had a catheter with childbirth), hematuria (chronic), has a history of renal stones and as mentioned vaginal discharge.  Has not been having intercourse so doesn't know if she has pain with it.  Menses lasts for 7 days with pad change every 2 hours.  Has skipped  O:   BP 122/68  Temp(Src) 99.1 F (37.3 C) (Oral)  Ht 5\' 6"  (1.676 m)  Wt 198 lb (89.812 kg)  BMI 31.97 kg/m2  LMP 02/27/2012         Abdomen: soft, tender diffusely without masses       Pelvic: EGBUS-wnl, vagina-normal, cervix-string visible, no lesions and without tenderness, uterus-normal size & tender, and adnexae-bilateral tenderness         (R>L)  but no masses.  Wet Prep: ph-4.5, whiff-positive, many clue cells U/A- 3+ blood otherwise normal UPT-negative  A: Pelvic Pain not relieved with NSAIDs     Mirena IUD     Exposure to Chlamydia     Hematuria     Abdominal Pain/Umblical Hernia     Bacterial Vaginosis     Antibiotic Therapy for Respiratory Infection  P: Urine for culture     GC/CT-pending      Pelvic Ultrasound for IUD placement and to rule out pelvic masses      Metronidazole 500 mg  #14 bid x 7 dfays no refills      Diflucan 150 mg #1 1 po stat 2 refills      Zithromax 500 mg #2  2 po stat no refills      Vicodin 5/300  #20  1-2 po q 6  hours prn-pain no refills      RTO-as scheduled or prn  Margrit Minner, PA-C

## 2012-03-13 LAB — GC/CHLAMYDIA PROBE AMP: CT Probe RNA: NEGATIVE

## 2012-03-13 LAB — URINE CULTURE: Colony Count: NO GROWTH

## 2012-03-14 LAB — CBC
Hemoglobin: 14.3 g/dL (ref 12.0–15.0)
MCH: 29.7 pg (ref 26.0–34.0)
MCV: 87.8 fL (ref 78.0–100.0)
Platelets: 429 10*3/uL — ABNORMAL HIGH (ref 150–400)
RBC: 4.82 MIL/uL (ref 3.87–5.11)
WBC: 12 10*3/uL — ABNORMAL HIGH (ref 4.0–10.5)

## 2012-03-15 LAB — COMPREHENSIVE METABOLIC PANEL
ALT: 18 U/L (ref 0–35)
CO2: 23 mEq/L (ref 19–32)
Calcium: 10.1 mg/dL (ref 8.4–10.5)
Chloride: 107 mEq/L (ref 96–112)
Glucose, Bld: 94 mg/dL (ref 70–99)
Sodium: 144 mEq/L (ref 135–145)
Total Protein: 7.6 g/dL (ref 6.0–8.3)

## 2012-09-06 HISTORY — PX: OTHER SURGICAL HISTORY: SHX169

## 2012-10-05 ENCOUNTER — Ambulatory Visit (INDEPENDENT_AMBULATORY_CARE_PROVIDER_SITE_OTHER): Payer: 59 | Admitting: Family Medicine

## 2012-10-05 ENCOUNTER — Encounter: Payer: Self-pay | Admitting: Family Medicine

## 2012-10-05 VITALS — BP 120/78 | HR 82 | Temp 98.8°F | Resp 18 | Ht 64.0 in | Wt 188.0 lb

## 2012-10-05 DIAGNOSIS — J329 Chronic sinusitis, unspecified: Secondary | ICD-10-CM

## 2012-10-05 NOTE — Progress Notes (Signed)
Subjective:    Patient ID: [name] [name], female    DOB: [birth year], 23 y.o.   MRN: [id number]  HPI Patient has a complicated medical history. Problems initially began about 2 years ago. She's had chronic problems in both years consisting of pain and pressure and intermittent ear infections. She is seeing a primary care physician. They treated her repeatedly with antibiotics for ear infections and for sinusitis with nasal steroid sprays. Eventually she developed a chronic pressure-like sensation in both the ears. She was referred to an ear nose and throat specialist where she underwent tube placement for chronic eustachian tube dysfunction. This help alleviate the constant pressure in both years. However she now has almost daily pain in both years. She also reports constant drainage coming out of the ears and have her tubes. She reports constant congestion pain and pressure in both sinuses. She reports watery discharge from both eyes. She reports sinus headaches. This is been ongoing for several months. She has tried repeated courses of amoxicillin, Augmentin, nasal steroid sprays, and prednisone without benefit. Past Medical History  Diagnosis Date  . Bacterial infection   . Umbilical hernia   . Renal stones    Current Outpatient Prescriptions on File Prior to Visit  Medication Sig Dispense Refill  . levonorgestrel (MIRENA) 20 MCG/24HR IUD 1 each by Intrauterine route once.       No current facility-administered medications on file prior to visit.   Allergies  Allergen Reactions  . Codeine    History   Social History  . Marital Status: Single    Spouse Name: N/A    Number of Children: N/A  . Years of Education: N/A   Occupational History  . Not on file.   Social History Main Topics  . Smoking status: Current Every Day Smoker  . Smokeless tobacco: Never Used  . Alcohol Use: No  . Drug Use: No  . Sexual Activity: Yes    Birth Control/ Protection: IUD     Comment: mirena    Other Topics Concern  . Not on file   Social History Narrative  . No narrative on file      Review of Systems  All other systems reviewed and are negative.       Objective:   Physical Exam  Vitals reviewed. Constitutional: She appears well-developed and well-nourished. No distress.  HENT:  Head: Normocephalic and atraumatic.  Right Ear: External ear and ear canal normal. No drainage or swelling. Tympanic membrane is perforated. Tympanic membrane is not injected and not scarred.  Left Ear: External ear and ear canal normal. No drainage. Tympanic membrane is injected and perforated. Tympanic membrane is not scarred.  Nose: Mucosal edema and rhinorrhea present. Right sinus exhibits no maxillary sinus tenderness and no frontal sinus tenderness. Left sinus exhibits no maxillary sinus tenderness and no frontal sinus tenderness.  Mouth/Throat: Oropharynx is clear and moist. No oropharyngeal exudate.  Cardiovascular: Normal rate, regular rhythm and normal heart sounds.  Exam reveals no gallop.   No murmur heard. Pulmonary/Chest: Effort normal and breath sounds normal. No respiratory distress. She has no wheezes. She has no rales. She exhibits no tenderness.  Abdominal: Soft. Bowel sounds are normal. She exhibits no distension. There is no tenderness. There is no rebound and no guarding.  Skin: She is not diaphoretic.   PE tubes are in place in both ears.          Assessment & Plan:  1. Sinusitis, chronic I believe the patient is  likely suffering from chronic rhinosinusitis. I believe this is likely causing her eustachian tube dysfunction, the drainage from both ears, rhinorrhea, sinus pressure, and the drainage from the eyes. Unfortunately she is still numerous courses of antibiotics, nasal steroid sprays, and well steroids. Therefore I'm going to proceed with a CT scan of the sinuses. If she has evidence for chronic sinusitis I recommend a repeat ENT consult for possible surgery  for sinus problems. If the sinus CAT scan is negative, I would recommend maximizing therapy for allergies including a nasal antihistamine and nasal steroid spray and oral antihistamine. - CT Maxillofacial W/Cm; Future

## 2012-10-07 ENCOUNTER — Encounter: Payer: Self-pay | Admitting: Physician Assistant

## 2012-10-07 ENCOUNTER — Other Ambulatory Visit: Payer: Self-pay | Admitting: Physician Assistant

## 2012-10-07 ENCOUNTER — Ambulatory Visit (INDEPENDENT_AMBULATORY_CARE_PROVIDER_SITE_OTHER): Payer: 59 | Admitting: Physician Assistant

## 2012-10-07 VITALS — BP 122/84 | HR 72 | Temp 98.1°F | Resp 18 | Wt 184.0 lb

## 2012-10-07 DIAGNOSIS — N760 Acute vaginitis: Secondary | ICD-10-CM

## 2012-10-07 DIAGNOSIS — J329 Chronic sinusitis, unspecified: Secondary | ICD-10-CM

## 2012-10-07 LAB — WET PREP FOR TRICH, YEAST, CLUE
Trich, Wet Prep: NONE SEEN
Yeast Wet Prep HPF POC: NONE SEEN

## 2012-10-07 MED ORDER — FLUTICASONE PROPIONATE 50 MCG/ACT NA SUSP: 2.0000 | Freq: Every day | NASAL | Status: DC

## 2012-10-07 NOTE — Progress Notes (Signed)
Patient ID: [name] [name] MRN: [id number], DOB: 07/30/1990, 22 y.o. Date of Encounter: 10/07/2012, 2:06 PM    Chief Complaint:  Chief Complaint  Patient presents with  . c/o vaginal infection    also something for allergies, rt ear hurts     HPI: 22 y.o. year old white female here with complaints of vaginal irritation and discharge.  Visit she has been seeing her gynecologist who has prescribed vaginal suppositories for  BV.  She is supposed to use them twice a week for several months. She has not completed this course. However because she was still having some symptoms she has also been using some over-the-counter Flagyl. She has just recently been using that even just yesterday. She is still had some symptoms she wanted to come here or for followup.  She office visit she just saw Dr. Tanya Nones recently for chronic sinusitis. Says that he was ordering a CT of her sinuses. Requesting some type of medicine to use in the meantime. However adds that Dr. Tanya Nones told her that she should not use repeated antibiotics and repeated prednisone. I discussed some Flonase and she says that that would help.     Home Meds: See attached medication section for any medications that were entered at today's visit. The computer does not put those onto this list.The following list is a list of meds entered prior to today's visit.   No current outpatient prescriptions on file prior to visit.   No current facility-administered medications on file prior to visit.    Allergies:  Allergies  Allergen Reactions  . Codeine       Review of Systems: See HPI for pertinent ROS. All other ROS negative.    Physical Exam: Blood pressure 122/84, pulse 72, temperature 98.1 F (36.7 C), temperature source Oral, resp. rate 18, weight 184 lb (83.462 kg), last menstrual period 09/23/2012., Body mass index is 31.57 kg/(m^2). General: Overweight white female. Appears in no acute distress. Lungs: Clear  bilaterally to auscultation without wheezes, rales, or rhonchi. Breathing is unlabored. Heart: Regular rhythm. No murmurs, rubs, or gallops. Abdomen: Soft, non-tender, non-distended with normoactive bowel sounds. No hepatomegaly. No rebound/guarding. No obvious abdominal masses. Pelvic Exam: External genitalia is normal with no erythema and no skin lesions. Vaginal mucosa is normal with no erythema. Cervix is normal. There is no significant discharge present. Manual exam is normal with no cervical motion tenderness. Msk:  Strength and tone normal for age. Extremities/Skin: Warm and dry. No clubbing or cyanosis. No edema. No rashes or suspicious lesions. Neuro: Alert and oriented X 3. Moves all extremities spontaneously. Gait is normal. CNII-XII grossly in tact. Psych:  Responds to questions appropriately with a normal affect.   Results for orders placed in visit on 10/07/12  WET PREP FOR TRICH, YEAST, CLUE      Result Value Range   Yeast Wet Prep HPF POC NONE SEEN  NONE SEEN   Trich, Wet Prep NONE SEEN  NONE SEEN   Clue Cells Wet Prep HPF POC NONE SEEN  NONE SEEN   WBC, Wet Prep HPF POC NONE SEEN  NONE SEEN     ASSESSMENT AND PLAN:  22 y.o. year old female with  1. Vaginitis and vulvovaginitis I reassured her that I thought no significant discharge on exam or other abnormalities on exam. Also discussed that no yeast and no clue cells were seen on wet prep. Will followup GC. Discussed that it is possible that her symptoms were secondary to either BV  or yeast and she has already treated this with the medication from her gynecologist as well as using the over-the-counter Flagyl. Told her to follow up as needed. Recommended that she add probiotic.  - WET PREP FOR TRICH, YEAST, CLUE - GC/chlamydia probe amp, genital  2. Sinusitis, chronic Follow up with CT and treatment as ordered by Dr. Tanya Nones her recent office visit. Add Flonase to help in the meantime. - fluticasone (FLONASE) 50 MCG/ACT  nasal spray; Place 2 sprays into the nose daily.  Dispense: 16 g; Refill: [address] [place], [place], [facility] 10/07/2012 2:06 PM

## 2012-10-12 ENCOUNTER — Telehealth: Payer: Self-pay | Admitting: Family Medicine

## 2012-10-12 NOTE — Telephone Encounter (Signed)
.  Patient aware we are waiting on insurance clearance

## 2012-10-12 NOTE — Telephone Encounter (Signed)
Patient calling about her ENT appointment.

## 2012-10-13 ENCOUNTER — Telehealth: Payer: Self-pay | Admitting: Family Medicine

## 2012-10-13 NOTE — Telephone Encounter (Signed)
Tried to call again and got same message about not accepting calls at this time

## 2012-10-13 NOTE — Telephone Encounter (Signed)
Tried to call pt and pt's phone is not accepting calls at this time. Will try again later.

## 2012-10-14 NOTE — Telephone Encounter (Signed)
Tried to call again and got same message  

## 2012-10-14 NOTE — Telephone Encounter (Signed)
Spoke with pt regarding the denial of CT and the need for the medications that WTP recommended for her to take and pt stated that she would like to go back to the ENT with [facility] and she would just like to wait until she sees him before starting any other medications.

## 2013-01-19 LAB — OB RESULTS CONSOLE RUBELLA ANTIBODY, IGM: Rubella: IMMUNE

## 2013-01-19 LAB — OB RESULTS CONSOLE HEPATITIS B SURFACE ANTIGEN: Hepatitis B Surface Ag: NEGATIVE

## 2013-01-19 LAB — OB RESULTS CONSOLE ANTIBODY SCREEN: Antibody Screen: NEGATIVE

## 2013-01-23 LAB — OB RESULTS CONSOLE HIV ANTIBODY (ROUTINE TESTING): HIV: NONREACTIVE

## 2013-01-23 LAB — OB RESULTS CONSOLE ABO/RH

## 2013-01-24 LAB — OB RESULTS CONSOLE GC/CHLAMYDIA
Chlamydia: NEGATIVE
GC PROBE AMP, GENITAL: NEGATIVE

## 2013-03-27 LAB — OB RESULTS CONSOLE RPR: RPR: NONREACTIVE

## 2013-03-30 ENCOUNTER — Encounter: Payer: Self-pay | Admitting: Family Medicine

## 2013-03-30 ENCOUNTER — Ambulatory Visit (INDEPENDENT_AMBULATORY_CARE_PROVIDER_SITE_OTHER): Payer: 59 | Admitting: Family Medicine

## 2013-03-30 VITALS — BP 138/72 | HR 76 | Temp 98.1°F | Resp 18 | Ht 64.5 in | Wt 208.0 lb

## 2013-03-30 DIAGNOSIS — J019 Acute sinusitis, unspecified: Secondary | ICD-10-CM

## 2013-03-30 DIAGNOSIS — Z331 Pregnant state, incidental: Secondary | ICD-10-CM

## 2013-03-30 DIAGNOSIS — Z3490 Encounter for supervision of normal pregnancy, unspecified, unspecified trimester: Secondary | ICD-10-CM | POA: Insufficient documentation

## 2013-03-30 DIAGNOSIS — R030 Elevated blood-pressure reading, without diagnosis of hypertension: Secondary | ICD-10-CM

## 2013-03-30 DIAGNOSIS — IMO0001 Reserved for inherently not codable concepts without codable children: Secondary | ICD-10-CM | POA: Insufficient documentation

## 2013-03-30 MED ORDER — AMOXICILLIN 500 MG PO CAPS: 500.0000 mg | ORAL_CAPSULE | Freq: Three times a day (TID) | ORAL | Status: DC

## 2013-03-30 NOTE — Assessment & Plan Note (Signed)
We'll treat with amoxicillin based on duration of symptoms and her history also have her use Mucinex as needed.

## 2013-03-30 NOTE — Patient Instructions (Signed)
Start amoxicillin Try Mucinex for congestion Call your GYN about the blood pressure  B6 tablets of nausea

## 2013-03-30 NOTE — Assessment & Plan Note (Signed)
Her repeat blood pressure in office was improved however it seems like she's been having some higher readings therefore have referred her back to her gynecologist to that she did have the appropriate workup done with elevated blood pressure during pregnancy

## 2013-03-30 NOTE — Progress Notes (Signed)
Patient ID: [name] FerrariJennifer L [name], female   DOB: [birth year], 23 y.o.   MRN: [id number]   Subjective:    Patient ID: [name] FerrariJennifer L [name], female    DOB: [birth year], 23 y.o.   MRN: [id number]  Patient presents for Sinus infection, L ear clogged, R ear itchy and [redacted] weeks pregnant  Patient here with concern for sinusitis. She has a history of chronic rhinosinusitis also had ear tubes due to recurrent ear infections. She's currently [redacted] weeks pregnant. She's noticed that the past couple weeks for sinus drainage and pressure as well as ear pain which is typical for her sinus infections. She's been trying to wait it out as she does not want to take any medications during pregnancy but has not improved. She was on Flonase in the past the because is category C. she does not want to use this. She did try an over-the-counter decongestant which was approved on her medication list for her OB/GYN however it did not help and she stopped using this about a week ago. She's not had any fever and has not had any cough. Of note she has been followed by GYN for some elevated blood pressure readings recently. She's not had any abnormal swelling with her blood pressure readings being elevated   Review Of Systems:  GEN- denies fatigue, fever, weight loss,weakness, recent illness HEENT- denies eye drainage, change in vision, +nasal discharge, CVS- denies chest pain, palpitations RESP- denies SOB, cough, wheeze ABD- denies N/V, change in stools, abd pain Neuro- denies headache, dizziness, syncope, seizure activity       Objective:    BP 138/72  Pulse 76  Temp(Src) 98.1 F (36.7 C) (Oral)  Resp 18  Ht 5' 4.5" (1.638 m)  Wt 208 lb (94.348 kg)  BMI 35.16 kg/m2  LMP 09/23/2012 GEN- NAD, alert and oriented x3 HEENT- PERRL, EOMI, non injected sclera, pink conjunctiva, MMM, oropharynx mild injection, TM clear Right clear, tube in place, left TM wax obscuring tube, small amount of clear fluid, light reflex normal,  +  maxillary sinus tenderness, +nasal drainage Neck- Supple, shotty LAD CVS- RRR, no murmur RESP-CTAB EXT- No edema Pulses- Radial 2+         Assessment & Plan:      Problem List Items Addressed This Visit   Elevated blood pressure     Her repeat blood pressure in office was improved however it seems like she's been having some higher readings therefore have referred her back to her gynecologist to that she did have the appropriate workup done with elevated blood pressure during pregnancy    Currently pregnant   Acute sinusitis - Primary     We'll treat with amoxicillin based on duration of symptoms and her history also have her use Mucinex as needed.    Relevant Medications      amoxicillin (AMOXIL) capsule      Note: This dictation was prepared with Dragon dictation along with smaller phrase technology. Any transcriptional errors that result from this process are unintentional.

## 2013-04-01 ENCOUNTER — Ambulatory Visit: Payer: 59 | Admitting: Family Medicine

## 2013-05-27 LAB — OB RESULTS CONSOLE HIV ANTIBODY (ROUTINE TESTING): HIV: NONREACTIVE

## 2013-07-21 LAB — OB RESULTS CONSOLE GC/CHLAMYDIA
CHLAMYDIA, DNA PROBE: NEGATIVE
Gonorrhea: NEGATIVE

## 2013-07-27 LAB — OB RESULTS CONSOLE ABO/RH: RH Type: POSITIVE

## 2013-07-27 LAB — OB RESULTS CONSOLE GBS: STREP GROUP B AG: NEGATIVE

## 2013-08-09 ENCOUNTER — Encounter (HOSPITAL_COMMUNITY): Payer: Self-pay | Admitting: *Deleted

## 2013-08-09 ENCOUNTER — Inpatient Hospital Stay (HOSPITAL_COMMUNITY): Payer: 59 | Admitting: Anesthesiology

## 2013-08-09 ENCOUNTER — Inpatient Hospital Stay (HOSPITAL_COMMUNITY)
Admission: AD | Admit: 2013-08-09 | Discharge: 2013-08-11 | DRG: 775 | Disposition: A | Discharge status: Home / Self Care | Payer: 59 | Source: Ambulatory Visit | Attending: Obstetrics and Gynecology | Admitting: Obstetrics and Gynecology

## 2013-08-09 ENCOUNTER — Encounter (HOSPITAL_COMMUNITY): Payer: 59 | Admitting: Anesthesiology

## 2013-08-09 DIAGNOSIS — Z818 Family history of other mental and behavioral disorders: Secondary | ICD-10-CM

## 2013-08-09 DIAGNOSIS — Z825 Family history of asthma and other chronic lower respiratory diseases: Secondary | ICD-10-CM | POA: Diagnosis not present

## 2013-08-09 DIAGNOSIS — O26839 Pregnancy related renal disease, unspecified trimester: Secondary | ICD-10-CM | POA: Diagnosis present

## 2013-08-09 DIAGNOSIS — Z833 Family history of diabetes mellitus: Secondary | ICD-10-CM

## 2013-08-09 DIAGNOSIS — O99344 Other mental disorders complicating childbirth: Secondary | ICD-10-CM | POA: Diagnosis present

## 2013-08-09 DIAGNOSIS — N289 Disorder of kidney and ureter, unspecified: Secondary | ICD-10-CM | POA: Diagnosis present

## 2013-08-09 DIAGNOSIS — Z87891 Personal history of nicotine dependence: Secondary | ICD-10-CM

## 2013-08-09 DIAGNOSIS — F411 Generalized anxiety disorder: Secondary | ICD-10-CM | POA: Diagnosis present

## 2013-08-09 DIAGNOSIS — Z87442 Personal history of urinary calculi: Secondary | ICD-10-CM | POA: Diagnosis not present

## 2013-08-09 DIAGNOSIS — F319 Bipolar disorder, unspecified: Secondary | ICD-10-CM | POA: Diagnosis present

## 2013-08-09 DIAGNOSIS — Z8249 Family history of ischemic heart disease and other diseases of the circulatory system: Secondary | ICD-10-CM

## 2013-08-09 DIAGNOSIS — O4290 Premature rupture of membranes, unspecified as to length of time between rupture and onset of labor, unspecified weeks of gestation: Secondary | ICD-10-CM | POA: Diagnosis present

## 2013-08-09 DIAGNOSIS — O4292 Full-term premature rupture of membranes, unspecified as to length of time between rupture and onset of labor: Secondary | ICD-10-CM

## 2013-08-09 DIAGNOSIS — J45909 Unspecified asthma, uncomplicated: Secondary | ICD-10-CM | POA: Diagnosis present

## 2013-08-09 HISTORY — DX: Unspecified asthma, uncomplicated: J45.909

## 2013-08-09 LAB — CBC
HCT: 30.3 % — ABNORMAL LOW (ref 36.0–46.0)
HEMOGLOBIN: 10.2 g/dL — AB (ref 12.0–15.0)
MCH: 29.7 pg (ref 26.0–34.0)
MCHC: 33.7 g/dL (ref 30.0–36.0)
MCV: 88.1 fL (ref 78.0–100.0)
PLATELETS: 360 10*3/uL (ref 150–400)
RBC: 3.44 MIL/uL — AB (ref 3.87–5.11)
RDW: 12.3 % (ref 11.5–15.5)
WBC: 15.9 10*3/uL — AB (ref 4.0–10.5)

## 2013-08-09 LAB — TYPE AND SCREEN
ABO/RH(D): AB POS
ANTIBODY SCREEN: NEGATIVE

## 2013-08-09 MED ORDER — PHENYLEPHRINE 40 MCG/ML (10ML) SYRINGE FOR IV PUSH (FOR BLOOD PRESSURE SUPPORT)
80.0000 ug | PREFILLED_SYRINGE | INTRAVENOUS | Status: DC | PRN
  Filled 2013-08-09: qty 10
  Filled 2013-08-09: qty 2

## 2013-08-09 MED ORDER — OXYTOCIN 40 UNITS IN LACTATED RINGERS INFUSION - SIMPLE MED: 62.5000 mL/h | INTRAVENOUS | Status: DC

## 2013-08-09 MED ORDER — FENTANYL 2.5 MCG/ML BUPIVACAINE 1/10 % EPIDURAL INFUSION (WH - ANES)
14.0000 mL/h | INTRAMUSCULAR | Status: DC | PRN
  Administered 2013-08-09: 14 mL/h via EPIDURAL
  Filled 2013-08-09: qty 125

## 2013-08-09 MED ORDER — FLEET ENEMA 7-19 GM/118ML RE ENEM: 1.0000 | ENEMA | RECTAL | Status: DC | PRN

## 2013-08-09 MED ORDER — EPHEDRINE 5 MG/ML INJ
10.0000 mg | INTRAVENOUS | Status: DC | PRN
  Filled 2013-08-09: qty 2

## 2013-08-09 MED ORDER — LIDOCAINE HCL (PF) 1 % IJ SOLN
30.0000 mL | INTRAMUSCULAR | Status: DC | PRN
  Administered 2013-08-10: 30 mL via SUBCUTANEOUS
  Filled 2013-08-09: qty 30

## 2013-08-09 MED ORDER — PHENYLEPHRINE 40 MCG/ML (10ML) SYRINGE FOR IV PUSH (FOR BLOOD PRESSURE SUPPORT)
80.0000 ug | PREFILLED_SYRINGE | INTRAVENOUS | Status: DC | PRN
  Filled 2013-08-09: qty 2

## 2013-08-09 MED ORDER — LACTATED RINGERS IV SOLN: 500.0000 mL | INTRAVENOUS | Status: DC | PRN

## 2013-08-09 MED ORDER — OXYTOCIN BOLUS FROM INFUSION
500.0000 mL | INTRAVENOUS | Status: DC
  Administered 2013-08-10: 500 mL via INTRAVENOUS

## 2013-08-09 MED ORDER — ONDANSETRON HCL 4 MG/2ML IJ SOLN
4.0000 mg | Freq: Four times a day (QID) | INTRAMUSCULAR | Status: DC | PRN
  Administered 2013-08-09: 4 mg via INTRAVENOUS
  Filled 2013-08-09: qty 2

## 2013-08-09 MED ORDER — LACTATED RINGERS IV SOLN
INTRAVENOUS | Status: DC
  Administered 2013-08-09 (×2): via INTRAVENOUS

## 2013-08-09 MED ORDER — LIDOCAINE HCL (PF) 1 % IJ SOLN
INTRAMUSCULAR | Status: DC | PRN
  Administered 2013-08-09 (×2): 4 mL

## 2013-08-09 MED ORDER — FENTANYL 2.5 MCG/ML BUPIVACAINE 1/10 % EPIDURAL INFUSION (WH - ANES)
INTRAMUSCULAR | Status: DC | PRN
  Administered 2013-08-09: 14 mL/h via EPIDURAL

## 2013-08-09 MED ORDER — FENTANYL 2.5 MCG/ML BUPIVACAINE 1/10 % EPIDURAL INFUSION (WH - ANES): 14.0000 mL/h | INTRAMUSCULAR | Status: DC | PRN

## 2013-08-09 MED ORDER — DIPHENHYDRAMINE HCL 50 MG/ML IJ SOLN: 12.5000 mg | INTRAMUSCULAR | Status: DC | PRN

## 2013-08-09 MED ORDER — IBUPROFEN 600 MG PO TABS
600.0000 mg | ORAL_TABLET | Freq: Four times a day (QID) | ORAL | Status: DC | PRN
  Filled 2013-08-09: qty 1

## 2013-08-09 MED ORDER — OXYTOCIN 40 UNITS IN LACTATED RINGERS INFUSION - SIMPLE MED
1.0000 m[IU]/min | INTRAVENOUS | Status: DC
Start: 2013-08-09 — End: 2013-08-10
  Administered 2013-08-09: 1 m[IU]/min via INTRAVENOUS
  Administered 2013-08-09: 13 m[IU]/min via INTRAVENOUS
  Filled 2013-08-09: qty 1000

## 2013-08-09 MED ORDER — CITRIC ACID-SODIUM CITRATE 334-500 MG/5ML PO SOLN
30.0000 mL | ORAL | Status: DC | PRN
Start: 2013-08-09 — End: 2013-08-10

## 2013-08-09 MED ORDER — LACTATED RINGERS IV SOLN
500.0000 mL | Freq: Once | INTRAVENOUS | Status: AC
  Administered 2013-08-09: 500 mL via INTRAVENOUS

## 2013-08-09 MED ORDER — TERBUTALINE SULFATE 1 MG/ML IJ SOLN
0.2500 mg | Freq: Once | INTRAMUSCULAR | Status: AC | PRN
Start: 2013-08-09 — End: 2013-08-09

## 2013-08-09 MED ORDER — ACETAMINOPHEN 325 MG PO TABS: 650.0000 mg | ORAL_TABLET | ORAL | Status: DC | PRN

## 2013-08-09 NOTE — H&P (Signed)
[place]:  [name], [name]            ACCOUNT NO.:  [id number]  MEDICAL RECORD NO.:  [id number]  LOCATION:  9168                          FACILITY:  [facility]  PHYSICIAN:  Malachi Prohomas F. Ambrose MantleHenley, M.D. DATE OF BIRTH:  01/14/1990  DATE OF ADMISSION:  08/09/2013 DATE OF DISCHARGE:                             HISTORY & PHYSICAL   HISTORY OF PRESENT ILLNESS:  This is a 23 year old white female, para 1- 0-0-1, [facility] with premature rupture of the membranes.  Her EDC was confirmed is August 21, 2013 based on an ultrasound on January 19, 2013, at 9 weeks and 4 days. Blood group and type, AB positive, negative antibody, RPR negative, urine culture negative, hepatitis B surface antigen negative, HIV negative, GC and Chlamydia negative.  Rubella immune.  First trimester screen negative, AFP negative, 1-hour Glucola 97.  Repeat HIV and RPR negative.  Repeat GC and Chlamydia negative.  Group B strep negative. The patient initially had a low-lying placenta, but this resolved.  She began her prenatal course at 9 weeks and 3 days.  She did have bleeding in the first trimester.  She has continued to have bleeding for a week or 2.  Her ultrasound at 18 weeks showed a low-lying placenta, repeated at 32 weeks showed resolution of the low-lying placenta.  Her initial weight at first prenatal visit was 192.  She gained up to 229 for total of 37-pound weight gain.  At her last prenatal visit on August 05, 2013, Dr. Ellyn HackBovard found her cervix to be 3 cm, 40%, vertex at -3.  Today, at approximately 11:15 a.m., she had spontaneous rupture of membranes.  She came to the hospital.  Ruptured membranes was confirmed.  She was admitted to the hospital and now has arrived on labor and delivery and has been started on low-dose Pitocin.  PAST MEDICAL HISTORY:  Revealed umbilical hernia, anxiety, bipolar, ADD. Currently off meds since December 2014, asthma, and kidney stones.  PAST SURGICAL  HISTORY:  Tubes in her ears, hernia repair in March 2014, D and C in 2012 for retained products of conception with heavy bleeding and cramping.  SOCIAL HISTORY:  The patient is a former smoker, quit December 12, 2012. Does not drink or take drugs.  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  She is single.  Her partner's name is Medical illustratorDarren Fawcett.  FAMILY HISTORY:  Mother with high blood pressure, asthma, migraines, and bipolar, mixed anxiety, and depression.  Maternal grandmother; high blood pressure, diabetes, and coronary heart disease.  Maternal grandfather; high blood pressure, diabetes, and coronary heart disease. Paternal grandmother; high blood pressure, diabetes, anxiety, and coronary heart disease.  Paternal grandfather; high blood pressure, diabetes.  Maternal uncle diabetes, bipolar, and coronary heart disease. Sister with bipolar, congenital heart disease.  Sister had open heart surgery as a toddler for a valve problem.  PHYSICAL EXAMINATION:  VITAL SIGNS:  On admission reveal temperature of 98.2, pulse 103, respirations 20, blood pressure 138/69. HEART:  Normal size and sounds.  No murmurs. LUNGS:  Clear to auscultation. ABDOMEN:  Fundal height at her last prenatal visit on July 31 was 37 cm. Fetal heart tones are normal.  Cervix was  thought to be 3 cm, 40% effaced.  Vertex presentation with ruptured membranes.  ADMITTING IMPRESSION:  Intrauterine pregnancy at 38 weeks and 2 days, premature rupture of the membranes.  The patient is admitted to begin the process of labor and has been placed on Pitocin.     Malachi Pro. Ambrose Mantle, M.D.     TFH/MEDQ  D:  08/09/2013  T:  08/09/2013  Job:  [id number]

## 2013-08-09 NOTE — Progress Notes (Signed)
Patient ID: [name] [name], female   DOB: [birth year], 23 y.o.   MRN: [id number] Pitocin at 13 mu/minute and the contractions are more painful. The cervix is 4-5 cm but only 50 % effaced and the vertex is at - 3 station. Even though she was admitted with PROM there is an intact forebag but I did not do an amniotomy because of the station of the head.

## 2013-08-09 NOTE — MAU Note (Signed)
Gush of water through shorts, clear fluid @ 1115, some bloody mucus also.  uc's every 8-12 minutes.

## 2013-08-09 NOTE — Anesthesia Procedure Notes (Signed)
Epidural Patient location during procedure: OB  Staffing Anesthesiologist: Shantel Helwig R Performed by: anesthesiologist   Preanesthetic Checklist Completed: patient identified, pre-op evaluation, timeout performed, IV checked, risks and benefits discussed and monitors and equipment checked  Epidural Patient position: sitting Prep: site prepped and draped and DuraPrep Patient monitoring: heart rate Approach: midline Location: L3-L4 Injection technique: LOR air and LOR saline  Needle:  Needle type: Tuohy  Needle gauge: 17 G Needle length: 9 cm Needle insertion depth: 8 cm Catheter type: closed end flexible Catheter size: 19 Gauge Catheter at skin depth: 14 cm Test dose: negative  Assessment Sensory level: T8 Events: blood not aspirated, injection not painful, no injection resistance, negative IV test and no paresthesia  Additional Notes Reason for block:procedure for pain   

## 2013-08-09 NOTE — Progress Notes (Signed)
Patient ID: [name] [name], female   DOB: July 06, 1990, 23 y.o.   MRN: [id number] Pitocin at 7 mu/ minute and the contractions are not painful or regular so will need to increase the pitocin.

## 2013-08-09 NOTE — Anesthesia Preprocedure Evaluation (Signed)
Anesthesia Evaluation  Patient identified by MRN, date of birth, ID band Patient awake    Reviewed: Allergy & Precautions, H&P , NPO status , Patient's Chart, lab work & pertinent test results  Airway Mallampati: II TM Distance: >3 FB Neck ROM: Full    Dental no notable dental hx.    Pulmonary asthma , former smoker,  breath sounds clear to auscultation  Pulmonary exam normal       Cardiovascular negative cardio ROS  Rhythm:Regular Rate:Normal     Neuro/Psych negative neurological ROS  negative psych ROS   GI/Hepatic negative GI ROS, Neg liver ROS,   Endo/Other  negative endocrine ROS  Renal/GU Renal disease     Musculoskeletal negative musculoskeletal ROS (+)   Abdominal   Peds  Hematology negative hematology ROS (+)   Anesthesia Other Findings   Reproductive/Obstetrics (+) Pregnancy                           Anesthesia Physical Anesthesia Plan  ASA: III  Anesthesia Plan: Epidural   Post-op Pain Management:    Induction:   Airway Management Planned:   Additional Equipment:   Intra-op Plan:   Post-operative Plan:   Informed Consent: I have reviewed the patients History and Physical, chart, labs and discussed the procedure including the risks, benefits and alternatives for the proposed anesthesia with the patient or authorized representative who has indicated his/her understanding and acceptance.     Plan Discussed with:   Anesthesia Plan Comments:         Anesthesia Quick Evaluation

## 2013-08-10 ENCOUNTER — Encounter (HOSPITAL_COMMUNITY): Payer: Self-pay | Admitting: Obstetrics

## 2013-08-10 LAB — ABO/RH: ABO/RH(D): AB POS

## 2013-08-10 LAB — RPR

## 2013-08-10 MED ORDER — DIPHENHYDRAMINE HCL 25 MG PO CAPS: 25.0000 mg | ORAL_CAPSULE | Freq: Four times a day (QID) | ORAL | Status: DC | PRN

## 2013-08-10 MED ORDER — CALCIUM CARBONATE ANTACID 500 MG PO CHEW
200.0000 mg | CHEWABLE_TABLET | Freq: Three times a day (TID) | ORAL | Status: DC
  Administered 2013-08-10 – 2013-08-11 (×3): 200 mg via ORAL
  Filled 2013-08-10 (×3): qty 1

## 2013-08-10 MED ORDER — WITCH HAZEL-GLYCERIN EX PADS: 1.0000 "application " | MEDICATED_PAD | CUTANEOUS | Status: DC | PRN

## 2013-08-10 MED ORDER — OXYCODONE-ACETAMINOPHEN 5-325 MG PO TABS
1.0000 | ORAL_TABLET | ORAL | Status: DC | PRN
  Administered 2013-08-10 – 2013-08-11 (×4): 1 via ORAL
  Filled 2013-08-10 (×4): qty 1

## 2013-08-10 MED ORDER — IBUPROFEN 600 MG PO TABS
600.0000 mg | ORAL_TABLET | Freq: Four times a day (QID) | ORAL | Status: DC
  Administered 2013-08-10 – 2013-08-11 (×6): 600 mg via ORAL
  Filled 2013-08-10 (×5): qty 1

## 2013-08-10 MED ORDER — ZOLPIDEM TARTRATE 5 MG PO TABS: 5.0000 mg | ORAL_TABLET | Freq: Every evening | ORAL | Status: DC | PRN

## 2013-08-10 MED ORDER — SENNOSIDES-DOCUSATE SODIUM 8.6-50 MG PO TABS
2.0000 | ORAL_TABLET | ORAL | Status: DC
  Administered 2013-08-11: 2 via ORAL
  Filled 2013-08-10: qty 2

## 2013-08-10 MED ORDER — BENZOCAINE-MENTHOL 20-0.5 % EX AERO
1.0000 "application " | INHALATION_SPRAY | CUTANEOUS | Status: DC | PRN
  Administered 2013-08-10: 1 via TOPICAL
  Filled 2013-08-10: qty 56

## 2013-08-10 MED ORDER — ONDANSETRON HCL 4 MG/2ML IJ SOLN: 4.0000 mg | INTRAMUSCULAR | Status: DC | PRN

## 2013-08-10 MED ORDER — LACTATED RINGERS IV SOLN: INTRAVENOUS | Status: AC

## 2013-08-10 MED ORDER — ONDANSETRON HCL 4 MG PO TABS: 4.0000 mg | ORAL_TABLET | ORAL | Status: DC | PRN

## 2013-08-10 MED ORDER — PNEUMOCOCCAL VAC POLYVALENT 25 MCG/0.5ML IJ INJ
0.5000 mL | INJECTION | INTRAMUSCULAR | Status: AC
  Administered 2013-08-11: 0.5 mL via INTRAMUSCULAR
  Filled 2013-08-10: qty 0.5

## 2013-08-10 MED ORDER — MEASLES, MUMPS & RUBELLA VAC ~~LOC~~ INJ: 0.5000 mL | INJECTION | Freq: Once | SUBCUTANEOUS | Status: DC

## 2013-08-10 MED ORDER — TETANUS-DIPHTH-ACELL PERTUSSIS 5-2.5-18.5 LF-MCG/0.5 IM SUSP: 0.5000 mL | Freq: Once | INTRAMUSCULAR | Status: DC

## 2013-08-10 MED ORDER — PRENATAL MULTIVITAMIN CH
1.0000 | ORAL_TABLET | Freq: Every day | ORAL | Status: DC
  Administered 2013-08-10 – 2013-08-11 (×2): 1 via ORAL
  Filled 2013-08-10 (×2): qty 1

## 2013-08-10 MED ORDER — OXYTOCIN 40 UNITS IN LACTATED RINGERS INFUSION - SIMPLE MED: 62.5000 mL/h | INTRAVENOUS | Status: DC | PRN

## 2013-08-10 MED ORDER — SIMETHICONE 80 MG PO CHEW
80.0000 mg | CHEWABLE_TABLET | ORAL | Status: DC | PRN
Start: 2013-08-10 — End: 2013-08-11

## 2013-08-10 MED ORDER — DIBUCAINE 1 % RE OINT
1.0000 | TOPICAL_OINTMENT | RECTAL | Status: DC | PRN
Start: 2013-08-10 — End: 2013-08-11

## 2013-08-10 MED ORDER — PRENATAL MULTIVITAMIN CH: 1.0000 | ORAL_TABLET | Freq: Every day | ORAL | Status: DC

## 2013-08-10 MED ORDER — LANOLIN HYDROUS EX OINT: TOPICAL_OINTMENT | CUTANEOUS | Status: DC | PRN

## 2013-08-10 NOTE — Progress Notes (Signed)
Patient ID: [name] FerrariJennifer L Wenrick, female   DOB: October 01, 1990, 23 y.o.   MRN: [id number] Delivery note:  The pt progressed to full dilatation and pushed well to deliver a living female infant spontaneously ROA over an intact perineum. Apgars were 9 and 9 at 1 and 5 minutes. The placenta was intact and the uterus was normal. There was a laceration superior to the urethral meatus that was bleeding so was repaired with 3-0 vicryl under local block.The urethra was prepped and a catheter was inserted into the bladder to confirm patency of the urethra. 10 cc's of 1 % xylocaine was instilled for a LML episiotomy but the pt delivered before an episiotomy was done. EBL 400 cc's.

## 2013-08-10 NOTE — Progress Notes (Signed)
Patient ID: [name] FerrariJennifer L [name], female   DOB: [birth year], 23 y.o.   MRN: [id number] DOD no complaints VS stable

## 2013-08-10 NOTE — Lactation Note (Signed)
This note was copied from the chart of [name] Tammy SoursJennifer Jenne. Lactation Consultation Note  Patient Name: [name] Tammy SoursJennifer Kutz UEAVW'UToday's Date: 08/10/2013 Reason for consult: Initial assessment Baby recently fed but Mom wanted to attempt latch while LC here. Baby sleepy and would not latch but LC reviewed postioning and demonstrated breast compression for Mom to obtain good depth w/latch. BF basics reviewed with Mom, cluster feeding discussed. Lactation brochure left for review, advised of OP services and support group. Encouraged to call for assist with latch or for questions/concerns.   Maternal Data Formula Feeding for Exclusion: Yes Reason for exclusion: Mother's choice to formula and breast feed on admission Has patient been taught Hand Expression?: Yes Does the patient have breastfeeding experience prior to this delivery?: Yes  Feeding Feeding Type: Breast Fed Length of feed: 0 min  LATCH Score/Interventions Latch: Too sleepy or reluctant, no latch achieved, no sucking elicited. Intervention(s): Adjust position;Assist with latch;Breast massage;Breast compression  Audible Swallowing: None  Type of Nipple: Everted at rest and after stimulation  Comfort (Breast/Nipple): Soft / non-tender     Hold (Positioning): Assistance needed to correctly position infant at breast and maintain latch. Intervention(s): Breastfeeding basics reviewed;Support Pillows;Position options;Skin to skin  LATCH Score: 5  Lactation Tools Discussed/Used WIC Program: Yes   Consult Status Consult Status: Follow-up Date: 08/11/13 Follow-up type: In-patient    Alfred LevinsGranger, Bob Daversa Ann 08/10/2013, 4:08 PM

## 2013-08-10 NOTE — Progress Notes (Signed)
Patient ID: [name] FerrariJennifer L [name], female   DOB: Jul 09, 1990, 23 y.o.   MRN: [id number] Pt requested and received an epidural and has good pain relief. The pitocin is at 17 mu/minute and her cervix is 4-5 cm 70 % effaced and the vertex is at - 2 station. Even though she was admitted with PROM there is a forebag and an amniotomy was done with clear fluid.

## 2013-08-10 NOTE — Anesthesia Postprocedure Evaluation (Signed)
  Anesthesia Post-op Note  Patient: Mare FerrariJennifer L Atkison  Procedure(s) Performed: * No procedures listed *  Patient Location: PACU and Mother/Baby  Anesthesia Type:Epidural  Level of Consciousness: awake, alert , oriented and patient cooperative  Airway and Oxygen Therapy: Patient Spontanous Breathing  Post-op Pain: none  Post-op Assessment: Post-op Vital signs reviewed, Patient's Cardiovascular Status Stable, Respiratory Function Stable, Patent Airway, No signs of Nausea or vomiting, Adequate PO intake, Pain level controlled, No headache, No backache, No residual numbness and No residual motor weakness  Post-op Vital Signs: Reviewed and stable  Last Vitals:  Filed Vitals:   08/10/13 0645  BP: 111/73  Pulse: 83  Temp: 36.9 C  Resp: 18    Complications: No apparent anesthesia complications

## 2013-08-11 LAB — CBC
HEMATOCRIT: 27.9 % — AB (ref 36.0–46.0)
Hemoglobin: 9.4 g/dL — ABNORMAL LOW (ref 12.0–15.0)
MCH: 29.9 pg (ref 26.0–34.0)
MCHC: 33.7 g/dL (ref 30.0–36.0)
MCV: 88.9 fL (ref 78.0–100.0)
Platelets: 331 10*3/uL (ref 150–400)
RBC: 3.14 MIL/uL — ABNORMAL LOW (ref 3.87–5.11)
RDW: 12.4 % (ref 11.5–15.5)
WBC: 10.2 10*3/uL (ref 4.0–10.5)

## 2013-08-11 MED ORDER — FERROUS SULFATE 325 (65 FE) MG PO TABS: 325.0000 mg | ORAL_TABLET | Freq: Two times a day (BID) | ORAL | Status: DC

## 2013-08-11 MED ORDER — OXYCODONE-ACETAMINOPHEN 5-325 MG PO TABS: 1.0000 | ORAL_TABLET | Freq: Four times a day (QID) | ORAL | Status: DC | PRN

## 2013-08-11 MED ORDER — IBUPROFEN 600 MG PO TABS: 600.0000 mg | ORAL_TABLET | Freq: Four times a day (QID) | ORAL | Status: DC | PRN

## 2013-08-11 NOTE — Discharge Instructions (Signed)
booklet °

## 2013-08-11 NOTE — Progress Notes (Signed)
Patient ID: [name] FerrariJennifer L Berninger, female   DOB: 1991/01/06, 23 y.o.   MRN: [id number] #2 afebrile BP normal no complaints ready for d/c

## 2013-08-11 NOTE — Progress Notes (Signed)
Clinical Social Work Department PSYCHOSOCIAL ASSESSMENT - MATERNAL/CHILD 08/11/2013  Patient:  [name] [name]  Account Number:  [id number]  Admit Date:  08/09/2013  Childs Name:   [name] [name]    Clinical Social Worker:  Greg Eckrich, CLINICAL SOCIAL WORKER   Date/Time:  08/11/2013 09:00 AM  Date Referred:  08/10/2013   Referral source  Central Nursery     Referred reason  Behavioral Health Issues   Other referral source:    I:  FAMILY / HOME ENVIRONMENT Child's legal guardian:  PARENT  Guardian - Name Guardian - Age Guardian - Address  [name] [name] 23 [address], [place], [place] [address]  [name] [name]  same as above   Other household support members/support persons Name Relationship DOB  Eric SON 3 1/2 years old   Other support:   MOB shared that there are numerous family members that live nearby.  She stated that she interacts with her family on a daily basis, and shared that her mother is her primary support in her family and believes that she can talk to her mother about anything.    II  PSYCHOSOCIAL DATA Information Source:  Patient Interview  Financial and Community Resources Employment:   MOB does not work.  FOB is fully employed at [employer].   Financial resources:  Private Insurance If Medicaid - County:  [place] Other  WIC   School / Grade:  N/A Maternity Care Coordinator / Child Services Coordination / Early Interventions:   N/A  Cultural issues impacting care:   None reported    III  STRENGTHS Strengths  Adequate Resources  Home prepared for Child (including basic supplies)  Supportive family/friends   Strength comment:    IV  RISK FACTORS AND CURRENT PROBLEMS Current Problem:  YES   Risk Factor & Current Problem Patient Issue Family Issue Risk Factor / Current Problem Comment  Mental Illness Y N MOB reports history of bipolar, anxiety, and ADHD.  She denied acute symptoms, and shared that she has not taken any  medication in over 4 years.    V  SOCIAL WORK ASSESSMENT CSW met with MOB in order to complete assessment. Consult ordered due to MOB's history of bipolar, anxiety, and ADHD.  MOB receptive to CSW assessment.  FOB present, but was observed to be sleeping throughout.  MOB open about her past mental health history.  Affect and mood appropriate to setting, thought process was tangential at times, speech slightly pressured, but MOB presented with awareness of importance of seeking mental health symptoms if needed.  No acute symptoms noted.   MOB shared that she is excited about having a second child, expressed that all basic needs are met, and that the home is prepared for [name].  She shared that her family and the FOB's family are very involved and supportive as they transition to having 2 children.  MOB discussed happiness with ability to stay at home with her children, and expressed fulfillment with being a mother.   MOB denied previous post-partum depression, but receptive to discussing signs and symptoms of post-partum depression and anxiety.  She is aware of increased risk due to her history of bipolar, anxiety, and ADHD, and is willing to reach out to family and medical providers if needed.  MOB shared that she was diagnosed and started on medication while she was in high school.  She denied previous therapy and inpatient admissions, and reflected upon her past when she highly irritable and labile.  MOB stated   that she was tried on various medications, but stopped medications when she became pregnant with her 3 1/2 year old son.  She shared that since then, there have been no acute stressors and she believes she has become more tolerant and it is more difficult for her to become irritable.  She denies recent depressive/manic states, shares that continues to be angry at times, but she was unable to identify any triggers.   MOB denied need to re-start medications since she "feels good", and denied need to  be in therapy.  MOB shared that she is not opposed to medications or therapy, and was receptive to exploring when it would be necessary for her to receive help.  MOB openly expressed that she will take medication in the future if needed if that will allow her to be a better mother.  She presented with ability to look forward and examine how acute stressors would negatively impact her ability to parent, and expressed belief that she would be receptive from family or medical providers if they felt that she would benefit from outpatient treatment in the future.    MOB aware of ongoing support available throughout time at hospital.    No barriers to discharge.   VI SOCIAL WORK PLAN Social Work Plan  Information/Referral to Community Resources  Patient/Family Education  No Further Intervention Required / No Barriers to Discharge   Type of pt/family education:   Post-partum depression and anxiety   If child protective services report - county:   If child protective services report - date:   Information/referral to community resources comment:   CSW explored thoughts and feelings related to re-starting therapy and medications, and disucssed factors that would indicate need to begin services.  MOB declined referral at this time.   Other social work plan:   None.     

## 2013-08-11 NOTE — Discharge Summary (Signed)
NAMELAURY, HUIZAR NO.:  [id number]  MEDICAL RECORD NO.:  [id number]  LOCATION:  9119                          FACILITY:  [facility]  PHYSICIAN:  Malachi Pro. Ambrose Mantle, M.D. DATE OF BIRTH:  [birth year]  DATE OF ADMISSION:  08/09/2013 DATE OF DISCHARGE:  08/11/2013                              DISCHARGE SUMMARY   A 23 year old white female, para 1-0-0-1, gravida 2, [facility] August 21, 2013, admitted with premature rupture of the membranes.  Her blood group and type, RPR, urine culture, hepatitis B surface antigen, HIV, GC and Chlamydia, rubella, first trimester screen, AFP, 1 hour Glucola, and group B strep were all normal.  Initially, she had a low-lying placenta, but resolved.  At 11:15 a.m., on the day of admission, she had spontaneous rupture of membranes.  She was admitted to the hospital, placed on low-dose Pitocin.  The cervix was 3 cm, 40%.  At 8:06 p.m., Pitocin was at 7 milliunits a minute.  Contractions were not painful or regular, so Pitocin was increased.  At 10:23 p.m., Pitocin 13 milliunits a minute.  Contractions were more painful, cervix 4-5 cm but only 50% effaced, vertex at -3.  There was an intact forebag even though she had been admitted with premature rupture of the membranes, but amniotomy was not done because the station of the head was too high.  She requested and received an epidural with good pain relief by 12:56 a.m. on the August 5th.  The Pitocin was at 17 milliunits a minute, cervix 4-5 cm, 70%, vertex -2, and amniotomy was done with clear fluid.  She progressed to full dilatation, pushed well to deliver a living female infant spontaneously ROA over an intact perineum by Dr. Ambrose Mantle.  Apgars were 9 and 9 at 1 and 5 minutes.  Placenta intact, uterus normal.  A laceration superior to the urethral meatus was bleeding, so it was repaired with 3- 0 Vicryl under local block.  Urethra was prepped and the catheter was inserted into the bladder to confirm  patency of the urethra.  A 10 mL of 1% Xylocaine was instilled for a left mediolateral episiotomy and the patient delivered before the episiotomy was done.  Blood loss 400 mL. Postpartum, the patient did well and was ready for discharge on the first postpartum day.  Initial hemoglobin 10.2, hematocrit 30.3, white count 15,900, platelet count 360,000, RPR nonreactive.  Followup hemoglobin 9.4.  FINAL DIAGNOSES:  Intrauterine pregnancy at 38 weeks and 2 days delivered vertex, premature rupture of the membranes.  OPERATION:  Spontaneous delivery vertex repair of periurethral laceration.  FINAL CONDITION:  Improved.  INSTRUCTIONS:  Include our regular discharge instruction booklet as well as the after visit summary.  Prescriptions for ferrous sulfate 325 mg twice daily; Percocet 5/325, 30 tablets, 1 every 6 hours as needed for pain; and Motrin 600 mg 30 tablets, 1 every 6 hours as needed for pain given at discharge.  The patient is advised to return in 6 weeks for followup examination.  She will have her circumcision of the baby done in the office.     Malachi Pro. Ambrose Mantle, M.D.     TFH/MEDQ  D:  08/11/2013  T:  08/11/2013  Job:  [id number]

## 2013-11-07 ENCOUNTER — Encounter (HOSPITAL_COMMUNITY): Payer: Self-pay | Admitting: Obstetrics

## 2014-01-06 NOTE — L&D Delivery Note (Signed)
Patient is 24 y.o. [id number] admitted for SROM and SOL.   Delivery Note At 2:02 AM a viable female was delivered via Vaginal, Spontaneous Delivery (Presentation: Left Occiput Anterior).  APGAR: 8, 9; weight pending.  Placenta status: Intact, Spontaneous.  Cord: 3 vessels with the following complications: None.  Cord pH: None.  Anesthesia:  None Episiotomy: None Lacerations:  Periurethral(hemostatic) Est. Blood Loss (mL): 300    Upon arrival baby was already delivered. Nurses had clamped and cut the cord and placed infant on maternal abdomen. Placenta delivered intact with 3V cord. Vaginal canal and perineum was inspected and hemostatic. Pitocin was started and uterus massaged until bleeding slowed. Counts of sharps, instruments, and lap pads were all correct.   Mom to postpartum.  Baby to Couplet care / Skin to Skin.   Caryl AdaJazma Phelps, DO 07/05/2014, 2:24 AM PGY-1, [facility]

## 2014-02-21 ENCOUNTER — Other Ambulatory Visit: Payer: Self-pay | Admitting: Obstetrics and Gynecology

## 2014-02-21 DIAGNOSIS — O3680X0 Pregnancy with inconclusive fetal viability, not applicable or unspecified: Secondary | ICD-10-CM

## 2014-02-22 ENCOUNTER — Encounter: Payer: Self-pay | Admitting: *Deleted

## 2014-02-22 ENCOUNTER — Other Ambulatory Visit: Payer: Self-pay | Admitting: Obstetrics and Gynecology

## 2014-02-22 ENCOUNTER — Ambulatory Visit (INDEPENDENT_AMBULATORY_CARE_PROVIDER_SITE_OTHER): Payer: Medicaid Other

## 2014-02-22 DIAGNOSIS — O3680X0 Pregnancy with inconclusive fetal viability, not applicable or unspecified: Secondary | ICD-10-CM

## 2014-02-22 DIAGNOSIS — O09892 Supervision of other high risk pregnancies, second trimester: Secondary | ICD-10-CM

## 2014-02-22 DIAGNOSIS — O4402 Placenta previa specified as without hemorrhage, second trimester: Secondary | ICD-10-CM

## 2014-02-22 DIAGNOSIS — O4412 Placenta previa with hemorrhage, second trimester: Secondary | ICD-10-CM

## 2014-02-22 DIAGNOSIS — O0930 Supervision of pregnancy with insufficient antenatal care, unspecified trimester: Secondary | ICD-10-CM

## 2014-02-22 NOTE — Progress Notes (Signed)
U/S-single active fetus, meas c/w 19+ wks EDD 07/15/2014, cx appears closed, posterior partial previa noted, Posterior gr 0 placenta, bilateral adnexa appears WNL, FHr- 149 bpm, female fetus, will complete anatomy screen with next ultrasound

## 2014-03-02 ENCOUNTER — Other Ambulatory Visit: Payer: Self-pay | Admitting: Advanced Practice Midwife

## 2014-03-02 ENCOUNTER — Ambulatory Visit (INDEPENDENT_AMBULATORY_CARE_PROVIDER_SITE_OTHER): Payer: Medicaid Other | Admitting: Advanced Practice Midwife

## 2014-03-02 ENCOUNTER — Encounter: Payer: Self-pay | Admitting: Advanced Practice Midwife

## 2014-03-02 VITALS — BP 124/72 | HR 80 | Wt 219.0 lb

## 2014-03-02 DIAGNOSIS — Z3492 Encounter for supervision of normal pregnancy, unspecified, second trimester: Secondary | ICD-10-CM

## 2014-03-02 DIAGNOSIS — Z3490 Encounter for supervision of normal pregnancy, unspecified, unspecified trimester: Secondary | ICD-10-CM | POA: Insufficient documentation

## 2014-03-02 DIAGNOSIS — Z3689 Encounter for other specified antenatal screening: Secondary | ICD-10-CM

## 2014-03-02 DIAGNOSIS — O09292 Supervision of pregnancy with other poor reproductive or obstetric history, second trimester: Secondary | ICD-10-CM

## 2014-03-02 DIAGNOSIS — Z118 Encounter for screening for other infectious and parasitic diseases: Secondary | ICD-10-CM

## 2014-03-02 DIAGNOSIS — O0930 Supervision of pregnancy with insufficient antenatal care, unspecified trimester: Secondary | ICD-10-CM

## 2014-03-02 DIAGNOSIS — Z1159 Encounter for screening for other viral diseases: Secondary | ICD-10-CM

## 2014-03-02 DIAGNOSIS — Z331 Pregnant state, incidental: Secondary | ICD-10-CM

## 2014-03-02 DIAGNOSIS — O4422 Partial placenta previa NOS or without hemorrhage, second trimester: Secondary | ICD-10-CM | POA: Insufficient documentation

## 2014-03-02 DIAGNOSIS — Z1389 Encounter for screening for other disorder: Secondary | ICD-10-CM

## 2014-03-02 DIAGNOSIS — Z0184 Encounter for antibody response examination: Secondary | ICD-10-CM

## 2014-03-02 DIAGNOSIS — Z0283 Encounter for blood-alcohol and blood-drug test: Secondary | ICD-10-CM

## 2014-03-02 DIAGNOSIS — Z114 Encounter for screening for human immunodeficiency virus [HIV]: Secondary | ICD-10-CM

## 2014-03-02 DIAGNOSIS — Z363 Encounter for antenatal screening for malformations: Secondary | ICD-10-CM

## 2014-03-02 DIAGNOSIS — Z3482 Encounter for supervision of other normal pregnancy, second trimester: Secondary | ICD-10-CM

## 2014-03-02 DIAGNOSIS — Z113 Encounter for screening for infections with a predominantly sexual mode of transmission: Secondary | ICD-10-CM

## 2014-03-02 DIAGNOSIS — R319 Hematuria, unspecified: Secondary | ICD-10-CM | POA: Insufficient documentation

## 2014-03-02 LAB — POCT URINALYSIS DIPSTICK
Blood, UA: 4
Glucose, UA: NEGATIVE
KETONES UA: NEGATIVE
Leukocytes, UA: NEGATIVE
Nitrite, UA: NEGATIVE
PROTEIN UA: NEGATIVE

## 2014-03-02 NOTE — Progress Notes (Signed)
Subjective:    [name] [name] is a [id number] [redacted]w[redacted]d being seen today for her first obstetrical visit.  Her obstetrical history is significant for short interval bt pregnancies.  Did not know she was pregnant until she was until about a month ago.  Pregnancy history fully reviewed. She had one visit with Dr. Ambrose Mantle just for labs.  Her dating [place] was last week.   Patient reports backache, sounds muscular.  Also some mild nausea, no vomiting  Filed Vitals:   03/02/14 1447  BP: 124/72  Pulse: 80  Weight: 99.338 kg (219 lb)    HISTORY: OB History  Gravida Para Term Preterm AB SAB TAB Ectopic Multiple Living  # Outcome Date GA Lbr Len/2nd Weight Sex Delivery Anes PTL Lv  3 Current           2 Term 08/10/13 [redacted]w[redacted]d 15:52 / 00:28 3.485 kg (7 lb 10.9 oz) M Vag-Spont EPI  Y  1 Term 01/07/10   3.118 kg (6 lb 14 oz) M Vag-Spont        Past Medical History  Diagnosis Date  . Bacterial infection   . Umbilical hernia   . Renal stones   . Asthma   . Frequent headaches     occ migraines  . Bipolar disease during pregnancy   . Renal stones    Past Surgical History  Procedure Laterality Date  . Dilation and evacuation    . Wisdom tooth extraction    . Hernia repair    . Tubes in ears  09/2012   Family History  Problem Relation Age of Onset  . Heart disease Paternal Grandfather   . Heart disease Paternal Grandmother   . Heart disease Maternal Grandmother   . Diabetes Maternal Grandmother   . Heart disease Maternal Grandfather   . Diabetes Maternal Grandfather   . Other Sister     congenital heart defect; open heart surgery @ 24 year old  . Diabetes Maternal Uncle   . Hypertension Mother   . Asthma Son   . Asthma Son      Exam                                           Skin: normal coloration and turgor, no rashes    Neurologic: oriented, normal, normal mood   Extremities: normal strength, tone, and muscle mass   HEENT PERRLA   Mouth/Teeth mucous  membranes moist, normal dentition   Neck supple and no masses   Cardiovascular: regular rate and rhythm   Respiratory:  appears well, vitals normal, no respiratory distress, acyanotic   Abdomen: soft, non-tender;  FHR: 150          Assessment:    Pregnancy: [id number] Patient Active Problem List   Diagnosis Date Noted  . Supervision of normal pregnancy 03/02/2014  . Hematuria 03/02/2014  . 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 Acute sinusitis 03/30/2013  . Elevated blood pressure 03/30/2013  . Umbilical hernia         Plan:     Request prenatal records:  Will hold off on labwork until then Draw AFP next week (pt wants to avoid and extra stick in case we need some more prenatal labs TBD when we get her prenatal records) Continue prenatal vitamins--change to Citranatal B  Calm for nausea Problem list reviewed and updated  Reviewed recommended weight gain based on pre-gravid BMI  Encouraged well-balanced diet Genetic Screening discussed Quad Screen: requested.  Ultrasound discussed; fetal survey: requested.  Follow up in tomorrow for anatomy scan.  CRESENZO-DISHMAN,Ranesha Val 03/02/2014

## 2014-03-02 NOTE — Progress Notes (Signed)
Pt denies any problems or concerns at this time. Pt given CCNC form and lab consents to read over and sign.  

## 2014-03-03 ENCOUNTER — Other Ambulatory Visit: Payer: Self-pay | Admitting: Advanced Practice Midwife

## 2014-03-03 ENCOUNTER — Ambulatory Visit (INDEPENDENT_AMBULATORY_CARE_PROVIDER_SITE_OTHER): Payer: Medicaid Other

## 2014-03-03 DIAGNOSIS — Z3689 Encounter for other specified antenatal screening: Secondary | ICD-10-CM

## 2014-03-03 DIAGNOSIS — O0930 Supervision of pregnancy with insufficient antenatal care, unspecified trimester: Secondary | ICD-10-CM

## 2014-03-03 DIAGNOSIS — O09292 Supervision of pregnancy with other poor reproductive or obstetric history, second trimester: Secondary | ICD-10-CM

## 2014-03-03 DIAGNOSIS — O4412 Placenta previa with hemorrhage, second trimester: Secondary | ICD-10-CM

## 2014-03-03 DIAGNOSIS — Z36 Encounter for antenatal screening of mother: Secondary | ICD-10-CM

## 2014-03-03 LAB — PMP SCREEN PROFILE (10S), URINE
AMPHETAMINE SCRN UR: NEGATIVE ng/mL
BENZODIAZEPINE SCREEN, URINE: NEGATIVE ng/mL
Barbiturate Screen, Ur: NEGATIVE ng/mL
CREATININE(CRT), U: 114.7 mg/dL (ref 20.0–300.0)
Cannabinoids Ur Ql Scn: POSITIVE ng/mL
Cocaine(Metab.)Screen, Urine: NEGATIVE ng/mL
Methadone Scn, Ur: NEGATIVE ng/mL
OXYCODONE+OXYMORPHONE UR QL SCN: NEGATIVE ng/mL
Opiate Scrn, Ur: NEGATIVE ng/mL
PCP Scrn, Ur: NEGATIVE ng/mL
PROPOXYPHENE SCREEN: NEGATIVE ng/mL
Ph of Urine: 6 (ref 4.5–8.9)

## 2014-03-03 NOTE — Progress Notes (Signed)
U/S(20+6wks)-active fetus, meas c/w dates, fluid WNL, posterior partial previa noted Gr 0 placenta, cx appears closed, bilateral adnexa appears WNL, FHR- 153 bpm, female fetus, no major abnl noted

## 2014-03-06 ENCOUNTER — Encounter: Payer: Self-pay | Admitting: *Deleted

## 2014-03-06 ENCOUNTER — Other Ambulatory Visit: Payer: Medicaid Other

## 2014-03-07 ENCOUNTER — Other Ambulatory Visit: Payer: Medicaid Other

## 2014-03-07 DIAGNOSIS — Z3682 Encounter for antenatal screening for nuchal translucency: Secondary | ICD-10-CM

## 2014-03-09 LAB — AFP, QUAD SCREEN
DIA Mom Value: 1.61
DIA Value (EIA): 338.22 pg/mL
DSR (By Age)    1 IN: 1031
DSR (Second Trimester) 1 IN: 6075
GESTATIONAL AGE AFP: 21.4 wk
MSAFP Mom: 1.88
MSAFP: 104.8 ng/mL
MSHCG Mom: 1.05
MSHCG: 18869 m[IU]/mL
Maternal Age At EDD: 25 YEARS
Osb Risk: 1058
T18 (By Age): 1:4016 {titer}
TEST RESULTS AFP: NEGATIVE
UE3 MOM: 0.96
UE3 VALUE: 2.06 ng/mL
Weight: 216 [lb_av]

## 2014-03-13 ENCOUNTER — Ambulatory Visit (INDEPENDENT_AMBULATORY_CARE_PROVIDER_SITE_OTHER): Payer: Medicaid Other | Admitting: Women's Health

## 2014-03-13 ENCOUNTER — Encounter: Payer: Self-pay | Admitting: Women's Health

## 2014-03-13 VITALS — BP 124/58 | HR 84 | Wt 222.0 lb

## 2014-03-13 DIAGNOSIS — O4412 Placenta previa with hemorrhage, second trimester: Secondary | ICD-10-CM

## 2014-03-13 DIAGNOSIS — Z369 Encounter for antenatal screening, unspecified: Secondary | ICD-10-CM

## 2014-03-13 DIAGNOSIS — Z23 Encounter for immunization: Secondary | ICD-10-CM | POA: Diagnosis not present

## 2014-03-13 DIAGNOSIS — O09899 Supervision of other high risk pregnancies, unspecified trimester: Secondary | ICD-10-CM | POA: Insufficient documentation

## 2014-03-13 DIAGNOSIS — O09892 Supervision of other high risk pregnancies, second trimester: Secondary | ICD-10-CM

## 2014-03-13 DIAGNOSIS — Z331 Pregnant state, incidental: Secondary | ICD-10-CM

## 2014-03-13 DIAGNOSIS — Z1389 Encounter for screening for other disorder: Secondary | ICD-10-CM

## 2014-03-13 DIAGNOSIS — Z3492 Encounter for supervision of normal pregnancy, unspecified, second trimester: Secondary | ICD-10-CM

## 2014-03-13 LAB — POCT URINALYSIS DIPSTICK
Glucose, UA: NEGATIVE
Ketones, UA: NEGATIVE
Leukocytes, UA: NEGATIVE
Nitrite, UA: NEGATIVE

## 2014-03-13 LAB — OB RESULTS CONSOLE HIV ANTIBODY (ROUTINE TESTING): HIV: NONREACTIVE

## 2014-03-13 NOTE — Progress Notes (Signed)
Low-risk OB appointment [id number] [redacted]w[redacted]d Estimated Date of Delivery: 07/15/14 BP 124/58 mmHg  Pulse 84  Wt 222 lb (100.699 kg)  LMP  (LMP Unknown)  BP, weight, and urine reviewed.  Refer to obstetrical flow sheet for FH & FHR.  Reports good fm.  Denies regular uc's, lof, vb, or uti s/s. No complaints. [facility], pt thinks was just initial confirmatory labs? Last pap 2014 and normal per pt. Will get pn1 today.  Reviewed ptl s/s, fm. Pelvic rest d/t partial previa.  Plan:  Continue routine obstetrical care  F/U in 3wks for OB appointment  Flu shot today

## 2014-03-13 NOTE — Patient Instructions (Signed)
Call the office ([phone]) or go to [facility] if:  You begin to have strong, frequent contractions  Your water breaks.  Sometimes it is a big gush of fluid, sometimes it is just a trickle that keeps getting your panties wet or running down your legs  You have vaginal bleeding.  It is normal to have a small amount of spotting if your cervix was checked.   You don't feel your baby moving like normal.  If you don't, get you something to eat and drink and lay down and focus on feeling your baby move.   If your baby is still not moving like normal, you should call the office or go to [facility].  Second Trimester of Pregnancy The second trimester is from week 13 through week 28, months 4 through 6. The second trimester is often a time when you feel your best. Your body has also adjusted to being pregnant, and you begin to feel better physically. Usually, morning sickness has lessened or quit completely, you may have more energy, and you may have an increase in appetite. The second trimester is also a time when the fetus is growing rapidly. At the end of the sixth month, the fetus is about 9 inches long and weighs about 1 pounds. You will likely begin to feel the baby move (quickening) between 18 and 20 weeks of the pregnancy. BODY CHANGES Your body goes through many changes during pregnancy. The changes vary from woman to woman.   Your weight will continue to increase. You will notice your lower abdomen bulging out.  You may begin to get stretch marks on your hips, abdomen, and breasts.  You may develop headaches that can be relieved by medicines approved by your health care provider.  You may urinate more often because the fetus is pressing on your bladder.  You may develop or continue to have heartburn as a result of your pregnancy.  You may develop constipation because certain hormones are causing the muscles that push waste through your intestines to slow down.  You may  develop hemorrhoids or swollen, bulging veins (varicose veins).  You may have back pain because of the weight gain and pregnancy hormones relaxing your joints between the bones in your pelvis and as a result of a shift in weight and the muscles that support your balance.  Your breasts will continue to grow and be tender.  Your gums may bleed and may be sensitive to brushing and flossing.  Dark spots or blotches (chloasma, mask of pregnancy) may develop on your face. This will likely fade after the baby is born.  A dark line from your belly button to the pubic area (linea nigra) may appear. This will likely fade after the baby is born.  You may have changes in your hair. These can include thickening of your hair, rapid growth, and changes in texture. Some women also have hair loss during or after pregnancy, or hair that feels dry or thin. Your hair will most likely return to normal after your baby is born. WHAT TO EXPECT AT YOUR PRENATAL VISITS During a routine prenatal visit:  You will be weighed to make sure you and the fetus are growing normally.  Your blood pressure will be taken.  Your abdomen will be measured to track your baby's growth.  The fetal heartbeat will be listened to.  Any test results from the previous visit will be discussed. Your health care provider may ask you:  How you are   feeling.  If you are feeling the baby move.  If you have had any abnormal symptoms, such as leaking fluid, bleeding, severe headaches, or abdominal cramping.  If you have any questions. Other tests that may be performed during your second trimester include:  Blood tests that check for:  Low iron levels (anemia).  Gestational diabetes (between 24 and 28 weeks).  Rh antibodies.  Urine tests to check for infections, diabetes, or protein in the urine.  An ultrasound to confirm the proper growth and development of the baby.  An amniocentesis to check for possible genetic  problems.  Fetal screens for spina bifida and Down syndrome. HOME CARE INSTRUCTIONS   Avoid all smoking, herbs, alcohol, and unprescribed drugs. These chemicals affect the formation and growth of the baby.  Follow your health care provider's instructions regarding medicine use. There are medicines that are either safe or unsafe to take during pregnancy.  Exercise only as directed by your health care provider. Experiencing uterine cramps is a good sign to stop exercising.  Continue to eat regular, healthy meals.  Wear a good support bra for breast tenderness.  Do not use hot tubs, steam rooms, or saunas.  Wear your seat belt at all times when driving.  Avoid raw meat, uncooked cheese, cat litter boxes, and soil used by cats. These carry germs that can cause birth defects in the baby.  Take your prenatal vitamins.  Try taking a stool softener (if your health care provider approves) if you develop constipation. Eat more high-fiber foods, such as fresh vegetables or fruit and whole grains. Drink plenty of fluids to keep your urine clear or pale yellow.  Take warm sitz baths to soothe any pain or discomfort caused by hemorrhoids. Use hemorrhoid cream if your health care provider approves.  If you develop varicose veins, wear support hose. Elevate your feet for 15 minutes, 3-4 times a day. Limit salt in your diet.  Avoid heavy lifting, wear low heel shoes, and practice good posture.  Rest with your legs elevated if you have leg cramps or low back pain.  Visit your dentist if you have not gone yet during your pregnancy. Use a soft toothbrush to brush your teeth and be gentle when you floss.  A sexual relationship may be continued unless your health care provider directs you otherwise.  Continue to go to all your prenatal visits as directed by your health care provider. SEEK MEDICAL CARE IF:   You have dizziness.  You have mild pelvic cramps, pelvic pressure, or nagging pain in the  abdominal area.  You have persistent nausea, vomiting, or diarrhea.  You have a bad smelling vaginal discharge.  You have pain with urination. SEEK IMMEDIATE MEDICAL CARE IF:   You have a fever.  You are leaking fluid from your vagina.  You have spotting or bleeding from your vagina.  You have severe abdominal cramping or pain.  You have rapid weight gain or loss.  You have shortness of breath with chest pain.  You notice sudden or extreme swelling of your face, hands, ankles, feet, or legs.  You have not felt your baby move in over an hour.  You have severe headaches that do not go away with medicine.  You have vision changes. Document Released: 12/17/2000 Document Revised: 12/28/2012 Document Reviewed: 02/24/2012 ExitCare Patient Information 2015 [employer]. This information is not intended to replace advice given to you by your health care provider. Make sure you discuss any questions you have with   your health care provider.     

## 2014-03-14 LAB — RPR: RPR Ser Ql: NONREACTIVE

## 2014-03-14 LAB — HEPATITIS B SURFACE ANTIGEN: HEP B S AG: NEGATIVE

## 2014-03-14 LAB — CBC
HEMATOCRIT: 34.1 % (ref 34.0–46.6)
HEMOGLOBIN: 11.6 g/dL (ref 11.1–15.9)
MCH: 29.9 pg (ref 26.6–33.0)
MCHC: 34 g/dL (ref 31.5–35.7)
MCV: 88 fL (ref 79–97)
Platelets: 293 10*3/uL (ref 150–379)
RBC: 3.88 x10E6/uL (ref 3.77–5.28)
RDW: 13 % (ref 12.3–15.4)
WBC: 10.6 10*3/uL (ref 3.4–10.8)

## 2014-03-14 LAB — RUBELLA SCREEN: RUBELLA: 3.39 {index} (ref >0.99)

## 2014-03-14 LAB — ABO/RH: Rh Factor: POSITIVE

## 2014-03-14 LAB — ANTIBODY SCREEN: ANTIBODY SCREEN: NEGATIVE

## 2014-03-14 LAB — HIV ANTIBODY (ROUTINE TESTING W REFLEX): HIV Screen 4th Generation wRfx: NONREACTIVE

## 2014-03-14 LAB — VARICELLA ZOSTER ANTIBODY, IGG: Varicella zoster IgG: 142 index — ABNORMAL LOW (ref >165)

## 2014-03-15 LAB — GC/CHLAMYDIA PROBE AMP
Chlamydia trachomatis, NAA: NEGATIVE
NEISSERIA GONORRHOEAE BY PCR: NEGATIVE

## 2014-04-03 ENCOUNTER — Encounter: Payer: Self-pay | Admitting: Obstetrics & Gynecology

## 2014-04-03 ENCOUNTER — Ambulatory Visit (INDEPENDENT_AMBULATORY_CARE_PROVIDER_SITE_OTHER): Payer: Medicaid Other | Admitting: Obstetrics & Gynecology

## 2014-04-03 VITALS — BP 120/60 | HR 92 | Wt 228.0 lb

## 2014-04-03 DIAGNOSIS — Z331 Pregnant state, incidental: Secondary | ICD-10-CM

## 2014-04-03 DIAGNOSIS — Z1389 Encounter for screening for other disorder: Secondary | ICD-10-CM

## 2014-04-03 DIAGNOSIS — O4422 Partial placenta previa NOS or without hemorrhage, second trimester: Secondary | ICD-10-CM

## 2014-04-03 DIAGNOSIS — Z3492 Encounter for supervision of normal pregnancy, unspecified, second trimester: Secondary | ICD-10-CM

## 2014-04-03 LAB — POCT URINALYSIS DIPSTICK
Blood, UA: 3
GLUCOSE UA: NEGATIVE
Ketones, UA: NEGATIVE
LEUKOCYTES UA: NEGATIVE
NITRITE UA: NEGATIVE

## 2014-04-03 NOTE — Progress Notes (Signed)
[id number] 2686w2d Estimated Date of Delivery: 07/15/14  Blood pressure 120/60, pulse 92, weight 228 lb (103.42 kg), not currently breastfeeding.   BP weight and urine results all reviewed and noted.  Please refer to the obstetrical flow sheet for the fundal height and fetal heart rate documentation:  Patient reports good fetal movement, denies any bleeding and no rupture of membranes symptoms or regular contractions. Patient is without complaints. All questions were answered.  Plan:  Continued routine obstetrical care,   Follow up in 4 weeks for OB appointment, PN2 + sonogram to check previa status

## 2014-04-03 NOTE — Addendum Note (Signed)
Addended by: Richardson ChiquitoRAVIS, Kaliegh Willadsen M on: 04/03/2014 03:37 PM   Modules accepted: Orders

## 2014-05-02 ENCOUNTER — Ambulatory Visit (INDEPENDENT_AMBULATORY_CARE_PROVIDER_SITE_OTHER): Payer: Medicaid Other

## 2014-05-02 ENCOUNTER — Ambulatory Visit (INDEPENDENT_AMBULATORY_CARE_PROVIDER_SITE_OTHER): Payer: Medicaid Other | Admitting: Advanced Practice Midwife

## 2014-05-02 ENCOUNTER — Other Ambulatory Visit: Payer: Medicaid Other

## 2014-05-02 ENCOUNTER — Other Ambulatory Visit: Payer: Self-pay | Admitting: Obstetrics and Gynecology

## 2014-05-02 ENCOUNTER — Encounter: Payer: Self-pay | Admitting: Advanced Practice Midwife

## 2014-05-02 VITALS — BP 120/60 | HR 96 | Wt 229.5 lb

## 2014-05-02 DIAGNOSIS — Z331 Pregnant state, incidental: Secondary | ICD-10-CM

## 2014-05-02 DIAGNOSIS — Z3492 Encounter for supervision of normal pregnancy, unspecified, second trimester: Secondary | ICD-10-CM

## 2014-05-02 DIAGNOSIS — O4403 Placenta previa specified as without hemorrhage, third trimester: Secondary | ICD-10-CM

## 2014-05-02 DIAGNOSIS — O4402 Placenta previa specified as without hemorrhage, second trimester: Secondary | ICD-10-CM

## 2014-05-02 DIAGNOSIS — O4422 Partial placenta previa NOS or without hemorrhage, second trimester: Secondary | ICD-10-CM

## 2014-05-02 DIAGNOSIS — O4413 Placenta previa with hemorrhage, third trimester: Secondary | ICD-10-CM

## 2014-05-02 DIAGNOSIS — Z1389 Encounter for screening for other disorder: Secondary | ICD-10-CM

## 2014-05-02 DIAGNOSIS — Z369 Encounter for antenatal screening, unspecified: Secondary | ICD-10-CM

## 2014-05-02 DIAGNOSIS — R319 Hematuria, unspecified: Secondary | ICD-10-CM

## 2014-05-02 DIAGNOSIS — Z3493 Encounter for supervision of normal pregnancy, unspecified, third trimester: Secondary | ICD-10-CM

## 2014-05-02 LAB — POCT URINALYSIS DIPSTICK
Blood, UA: 3
Glucose, UA: NEGATIVE
Ketones, UA: NEGATIVE
Leukocytes, UA: NEGATIVE
NITRITE UA: NEGATIVE
PROTEIN UA: NEGATIVE

## 2014-05-02 NOTE — Progress Notes (Signed)
US for placenta position TA and TV, post pl gr 0, PL 2.5cm from cx, cx length 2.8cm,cephalic,afi 11cm

## 2014-05-02 NOTE — Patient Instructions (Signed)
Back Pain in Pregnancy Back pain during pregnancy is common. It happens in about half of all pregnancies. It is important for you and your baby that you remain active during your pregnancy.If you feel that back pain is not allowing you to remain active or sleep well, it is time to see your caregiver. Back pain may be caused by several factors related to changes during your pregnancy.Fortunately, unless you had trouble with your back before your pregnancy, the pain is likely to get better after you deliver. Low back pain usually occurs between the fifth and seventh months of pregnancy. It can, however, happen in the first couple months. Factors that increase the risk of back problems include:   Previous back problems.  Injury to your back.  Having twins or multiple births.  A chronic cough.  Stress.  Job-related repetitive motions.  Muscle or spinal disease in the back.  Family history of back problems, ruptured (herniated) discs, or osteoporosis.  Depression, anxiety, and panic attacks. CAUSES   When you are pregnant, your body produces a hormone called relaxin. This hormonemakes the ligaments connecting the low back and pubic bones more flexible. This flexibility allows the baby to be delivered more easily. When your ligaments are loose, your muscles need to work harder to support your back. Soreness in your back can come from tired muscles. Soreness can also come from back tissues that are irritated since they are receiving less support.  As the baby grows, it puts pressure on the nerves and blood vessels in your pelvis. This can cause back pain.  As the baby grows and gets heavier during pregnancy, the uterus pushes the stomach muscles forward and changes your center of gravity. This makes your back muscles work harder to maintain good posture. SYMPTOMS  Lumbar pain during pregnancy Lumbar pain during pregnancy usually occurs at or above the waist in the center of the back. There  may be pain and numbness that radiates into your leg or foot. This is similar to low back pain experienced by non-pregnant women. It usually increases with sitting for long periods of time, standing, or repetitive lifting. Tenderness may also be present in the muscles along your upper back. Posterior pelvic pain during pregnancy Pain in the back of the pelvis is more common than lumbar pain in pregnancy. It is a deep pain felt in your side at the waistline, or across the tailbone (sacrum), or in both places. You may have pain on one or both sides. This pain can also go into the buttocks and backs of the upper thighs. Pubic and groin pain may also be present. The pain does not quickly resolve with rest, and morning stiffness may also be present. Pelvic pain during pregnancy can be brought on by most activities. A high level of fitness before and during pregnancy may or may not prevent this problem. Labor pain is usually 1 to 2 minutes apart, lasts for about 1 minute, and involves a bearing down feeling or pressure in your pelvis. However, if you are at term with the pregnancy, constant low back pain can be the beginning of early labor, and you should be aware of this. DIAGNOSIS  X-rays of the back should not be done during the first 12 to 14 weeks of the pregnancy and only when absolutely necessary during the rest of the pregnancy. MRIs do not give off radiation and are safe during pregnancy. MRIs also should only be done when absolutely necessary. HOME CARE INSTRUCTIONS  Exercise   as directed by your caregiver. Exercise is the most effective way to prevent or manage back pain. If you have a back problem, it is especially important to avoid sports that require sudden body movements. Swimming and walking are great activities.  Do not stand in one place for long periods of time.  Do not wear high heels.  Sit in chairs with good posture. Use a pillow on your lower back if necessary. Make sure your head  rests over your shoulders and is not hanging forward.  Try sleeping on your side, preferably the left side, with a pillow or two between your legs. If you are sore after a night's rest, your bedmay betoo soft.Try placing a board between your mattress and box spring.  Listen to your body when lifting.If you are experiencing pain, ask for help or try bending yourknees more so you can use your leg muscles rather than your back muscles. Squat down when picking up something from the floor. Do not bend over.  Eat a healthy diet. Try to gain weight within your caregiver's recommendations.  Use heat or cold packs 3 to 4 times a day for 15 minutes to help with the pain.  Only take over-the-counter or prescription medicines for pain, discomfort, or fever as directed by your caregiver. Sudden (acute) back pain  Use bed rest for only the most extreme, acute episodes of back pain. Prolonged bed rest over 48 hours will aggravate your condition.  Ice is very effective for acute conditions.  Put ice in a plastic bag.  Place a towel between your skin and the bag.  Leave the ice on for 10 to 20 minutes every 2 hours, or as needed.  Using heat packs for 30 minutes prior to activities is also helpful. Continued back pain See your caregiver if you have continued problems. Your caregiver can help or refer you for appropriate physical therapy. With conditioning, most back problems can be avoided. Sometimes, a more serious issue may be the cause of back pain. You should be seen right away if new problems seem to be developing. Your caregiver may recommend:  A maternity girdle.  An elastic sling.  A back brace.  A massage therapist or acupuncture. SEEK MEDICAL CARE IF:   You are not able to do most of your daily activities, even when taking the pain medicine you were given.  You need a referral to a physical therapist or chiropractor.  You want to try acupuncture. SEEK IMMEDIATE MEDICAL CARE  IF:  You develop numbness, tingling, weakness, or problems with the use of your arms or legs.  You develop severe back pain that is no longer relieved with medicines.  You have a sudden change in bowel or bladder control.  You have increasing pain in other areas of the body.  You develop shortness of breath, dizziness, or fainting.  You develop nausea, vomiting, or sweating.  You have back pain which is similar to labor pains.  You have back pain along with your water breaking or vaginal bleeding.  You have back pain or numbness that travels down your leg.  Your back pain developed after you fell.  You develop pain on one side of your back. You may have a kidney stone.  You see blood in your urine. You may have a bladder infection or kidney stone.  You have back pain with blisters. You may have shingles. Back pain is fairly common during pregnancy but should not be accepted as just part of   you fell.  · You develop pain on one side of your back. You may have a kidney stone.  · You see blood in your urine. You may have a bladder infection or kidney stone.  · You have back pain with blisters. You may have shingles.  Back pain is fairly common during pregnancy but should not be accepted as just part of the process. Back pain should always be treated as soon as possible. This will make your pregnancy as pleasant as possible.  Document Released: 04/02/2005 Document Revised: 03/17/2011 Document Reviewed: 05/14/2010  ExitCare® Patient Information ©2015 [employer]. This information is not intended to replace advice given to you by your health care provider. Make sure you discuss any questions you have with your health care provider.  Back Exercises  Back exercises help treat and prevent back injuries. The goal of back exercises is to increase the strength of your abdominal and back muscles and the flexibility of your back. These exercises should be started when you no longer have back pain. Back exercises include:  · Pelvic Tilt. Lie on your back with your knees bent. Tilt your pelvis until the lower part of your back is against the floor. Hold this position 5 to 10 sec and repeat 5 to 10 times.  · Knee to Chest. Pull first 1 knee up against your chest and hold for 20 to 30 seconds, repeat this with the  other knee, and then both knees. This may be done with the other leg straight or bent, whichever feels better.  · Sit-Ups or Curl-Ups. Bend your knees 90 degrees. Start with tilting your pelvis, and do a partial, slow sit-up, lifting your trunk only 30 to 45 degrees off the floor. Take at least 2 to 3 seconds for each sit-up. Do not do sit-ups with your knees out straight. If partial sit-ups are difficult, simply do the above but with only tightening your abdominal muscles and holding it as directed.  · Hip-Lift. Lie on your back with your knees flexed 90 degrees. Push down with your feet and shoulders as you raise your hips a couple inches off the floor; hold for 10 seconds, repeat 5 to 10 times.  · Back arches. Lie on your stomach, propping yourself up on bent elbows. Slowly press on your hands, causing an arch in your low back. Repeat 3 to 5 times. Any initial stiffness and discomfort should lessen with repetition over time.  · Shoulder-Lifts. Lie face down with arms beside your body. Keep hips and torso pressed to floor as you slowly lift your head and shoulders off the floor.  Do not overdo your exercises, especially in the beginning. Exercises may cause you some mild back discomfort which lasts for a few minutes; however, if the pain is more severe, or lasts for more than 15 minutes, do not continue exercises until you see your caregiver. Improvement with exercise therapy for back problems is slow.   See your caregivers for assistance with developing a proper back exercise program.  Document Released: 01/31/2004 Document Revised: 03/17/2011 Document Reviewed: 10/24/2010  ExitCare® Patient Information ©2015 [employer]. This information is not intended to replace advice given to you by your health care provider. Make sure you discuss any questions you have with your health care provider.

## 2014-05-02 NOTE — Progress Notes (Signed)
Pt states that her back has been giving her trouble and wanted to know if there were any suggestions to relieve the pain.

## 2014-05-02 NOTE — Progress Notes (Signed)
[id number] 4537w3d Estimated Date of Delivery: 07/15/14  Blood pressure 120/60, pulse 96, weight 229 lb 8 oz (104.101 kg), unknown if currently breastfeeding.   BP weight and urine results all reviewed and noted.  Please refer to the obstetrical flow sheet for the fundal height and fetal heart rate documentation:US for placenta position TA and TV, post pl gr 0, PL 2.5cm from cx, cx length 2.8cm,cephalic,afi 11cm   Patient reports good fetal movement, denies any bleeding and no rupture of membranes symptoms or regular contractions. Patient c/o LBP.   All questions were answered.  Plan:  Continued routine obstetrical care, PN2 today.  Maternity belt/ back exercises  Follow up in 3 weeks for OB appointment,

## 2014-05-03 LAB — CBC
Hematocrit: 32.8 % — ABNORMAL LOW (ref 34.0–46.6)
Hemoglobin: 10.9 g/dL — ABNORMAL LOW (ref 11.1–15.9)
MCH: 29.5 pg (ref 26.6–33.0)
MCHC: 33.2 g/dL (ref 31.5–35.7)
MCV: 89 fL (ref 79–97)
PLATELETS: 284 10*3/uL (ref 150–379)
RBC: 3.69 x10E6/uL — AB (ref 3.77–5.28)
RDW: 12.6 % (ref 12.3–15.4)
WBC: 12.5 10*3/uL — ABNORMAL HIGH (ref 3.4–10.8)

## 2014-05-03 LAB — HIV ANTIBODY (ROUTINE TESTING W REFLEX): HIV Screen 4th Generation wRfx: NONREACTIVE

## 2014-05-03 LAB — RPR: RPR Ser Ql: NONREACTIVE

## 2014-05-03 LAB — GLUCOSE TOLERANCE, 2 HOURS W/ 1HR
GLUCOSE, 1 HOUR: 130 mg/dL (ref 65–179)
GLUCOSE, FASTING: 82 mg/dL (ref 65–91)
Glucose, 2 hour: 100 mg/dL (ref 65–152)

## 2014-05-03 LAB — ANTIBODY SCREEN: ANTIBODY SCREEN: NEGATIVE

## 2014-05-03 LAB — HSV 2 ANTIBODY, IGG: HSV 2 Glycoprotein G Ab, IgG: <0.91 index (ref 0.00–0.90)

## 2014-05-08 ENCOUNTER — Encounter: Payer: Self-pay | Admitting: Obstetrics and Gynecology

## 2014-05-08 ENCOUNTER — Telehealth: Payer: Self-pay | Admitting: Women's Health

## 2014-05-08 ENCOUNTER — Ambulatory Visit (INDEPENDENT_AMBULATORY_CARE_PROVIDER_SITE_OTHER): Payer: Medicaid Other | Admitting: Obstetrics and Gynecology

## 2014-05-08 VITALS — BP 118/60 | HR 96 | Wt 231.0 lb

## 2014-05-08 DIAGNOSIS — Z3493 Encounter for supervision of normal pregnancy, unspecified, third trimester: Secondary | ICD-10-CM

## 2014-05-08 DIAGNOSIS — Z331 Pregnant state, incidental: Secondary | ICD-10-CM

## 2014-05-08 DIAGNOSIS — Z1389 Encounter for screening for other disorder: Secondary | ICD-10-CM

## 2014-05-08 LAB — POCT URINALYSIS DIPSTICK
Glucose, UA: NEGATIVE
KETONES UA: NEGATIVE
Leukocytes, UA: NEGATIVE
Nitrite, UA: NEGATIVE
RBC UA: NEGATIVE

## 2014-05-08 NOTE — Progress Notes (Signed)
Patient ID: [name] FerrariJennifer L [name], female   DOB: July 19, 1990, 24 y.o.   MRN: [id number]  Work-in appointment  [id number] 7583w2d Estimated Date of Delivery: 07/15/14  Blood pressure 118/60, pulse 96, weight 231 lb (104.781 kg), unknown if currently breastfeeding.   refer to the ob flow sheet for FH and FHR, also BP, Wt, Urine results:notable for negative  Patient reports  + good fetal movement, denies any bleeding and no rupture of membranes symptoms. She states regular contractions that occurred this morning, but has since subsided. She notes contractions occurred every 2-10 minutes.  Patient complaints: Pt reports regular contractions that occurred this morning, but have since subsided. She tried lying down and drinking water with some relief. Pt denies any ruptures or vaginal bleeding.  FHR: 156 bpm FH: 27 Cervix form, long and closed Secretions normal Head ballotable  Questions were answered. Assessment: 1883w2d, [id number] Plan:  Continued routine obstetrical care; will plan on Mirena IUD or Nexplanon insert  F/u in 2 weeks for routine prenatal care   This chart was scribed for Tilda BurrowJohn Kylinn Shropshire V, MD by Gwenyth Oberatherine Macek, ED Scribe. This patient was seen in room 2 and the patient's care was started at 2:43 PM.   I personally performed the services described in this documentation, which was SCRIBED in my presence. The recorded information has been reviewed and considered accurate. It has been edited as necessary during review. Tilda BurrowFERGUSON,Nikaya Nasby V, MD

## 2014-05-08 NOTE — Progress Notes (Signed)
Pt worked in today for contractions. Pt denies any bleeding or gush of fluid. Pt states that the contractions were anywhere from 2-10 minutes apart. Pt states that she is now having some cramping.

## 2014-05-08 NOTE — Telephone Encounter (Signed)
Spoke with pt. Pt states she is having contractions every few minutes, between 2-10 minutes. Started around 10 am. She drank water and laid down on left side and it was painful to lay down. + baby movement. No spotting or bleeding. I spoke with Selena BattenKim and she advised she can be seen here or [facility] Pt to be seen here. Call transferred to front desk for appt. JSY

## 2014-05-12 LAB — US OB TRANSVAGINAL

## 2014-05-12 LAB — US OB LIMITED

## 2014-05-23 ENCOUNTER — Ambulatory Visit (INDEPENDENT_AMBULATORY_CARE_PROVIDER_SITE_OTHER): Payer: Medicaid Other | Admitting: Women's Health

## 2014-05-23 ENCOUNTER — Encounter: Payer: Self-pay | Admitting: Women's Health

## 2014-05-23 VITALS — BP 118/70 | HR 60 | Wt 229.0 lb

## 2014-05-23 DIAGNOSIS — Z3493 Encounter for supervision of normal pregnancy, unspecified, third trimester: Secondary | ICD-10-CM

## 2014-05-23 DIAGNOSIS — Z1389 Encounter for screening for other disorder: Secondary | ICD-10-CM

## 2014-05-23 DIAGNOSIS — Z331 Pregnant state, incidental: Secondary | ICD-10-CM

## 2014-05-23 LAB — POCT URINALYSIS DIPSTICK
Glucose, UA: NEGATIVE
KETONES UA: NEGATIVE
Leukocytes, UA: NEGATIVE
Nitrite, UA: NEGATIVE

## 2014-05-23 NOTE — Patient Instructions (Signed)
For your lower back pain you may:  Purchase a pregnancy belt from [employer], [employer], [employer], etc and wear it while you are up and about  Take warm baths  Use a heating pad to your lower back for no longer than 20 minutes at a time, and do not place near abdomen  Take tylenol as needed. Please follow directions on the bottle   Call the office [phone]) or go to [facility] if:  You begin to have strong, frequent contractions  Your water breaks.  Sometimes it is a big gush of fluid, sometimes it is just a trickle that keeps getting your panties wet or running down your legs  You have vaginal bleeding.  It is normal to have a small amount of spotting if your cervix was checked.   You don't feel your baby moving like normal.  If you don't, get you something to eat and drink and lay down and focus on feeling your baby move.  You should feel at least 10 movements in 2 hours.  If you don't, you should call the office or go to [facility].    You have a viral infection that will resolve on its own over time.  Symptoms typically last 3-7 days but can stretch out to 2-3 weeks.  Unfortunately, antibiotics are not helpful for viral infections.  Humidifier and saline nasal spray for nasal congestion  Regular robitussin, cough drops for cough  Warm salt water gargles for sore throat  Mucinex with lots of water to help you cough up the mucous in your chest if needed  Drink plenty of fluids and stay hydrated!  Wash your hands frequently.  Call if you are not improving by 7-10 days.    Third Trimester of Pregnancy The third trimester is from week 29 through week 42, months 7 through 9. The third trimester is a time when the fetus is growing rapidly. At the end of the ninth month, the fetus is about 20 inches in length and weighs 6-10 pounds.  BODY CHANGES Your body goes through many changes during pregnancy. The changes vary from woman to woman.  12. Your weight  will continue to increase. You can expect to gain 25-35 pounds (11-16 kg) by the end of the pregnancy. 13. You may begin to get stretch marks on your hips, abdomen, and breasts. 14. You may urinate more often because the fetus is moving lower into your pelvis and pressing on your bladder. 15. You may develop or continue to have heartburn as a result of your pregnancy. 16. You may develop constipation because certain hormones are causing the muscles that push waste through your intestines to slow down. 17. You may develop hemorrhoids or swollen, bulging veins (varicose veins). 18. You may have pelvic pain because of the weight gain and pregnancy hormones relaxing your joints between the bones in your pelvis. Backaches may result from overexertion of the muscles supporting your posture. 19. You may have changes in your hair. These can include thickening of your hair, rapid growth, and changes in texture. Some women also have hair loss during or after pregnancy, or hair that feels dry or thin. Your hair will most likely return to normal after your baby is born. 20. Your breasts will continue to grow and be tender. A yellow discharge may leak from your breasts called colostrum. 21. Your belly button may stick out. 22. You may feel short of breath because of your expanding uterus. 23. You may  notice the fetus "dropping," or moving lower in your abdomen. 24. You may have a bloody mucus discharge. This usually occurs a few days to a week before labor begins. 25. Your cervix becomes thin and soft (effaced) near your due date. WHAT TO EXPECT AT YOUR PRENATAL EXAMS  You will have prenatal exams every 2 weeks until week 36. Then, you will have weekly prenatal exams. During a routine prenatal visit:  You will be weighed to make sure you and the fetus are growing normally.  Your blood pressure is taken.  Your abdomen will be measured to track your baby's growth.  The fetal heartbeat will be listened  to.  Any test results from the previous visit will be discussed.  You may have a cervical check near your due date to see if you have effaced. At around 36 weeks, your caregiver will check your cervix. At the same time, your caregiver will also perform a test on the secretions of the vaginal tissue. This test is to determine if a type of bacteria, Group B streptococcus, is present. Your caregiver will explain this further. Your caregiver may ask you:  What your birth plan is.  How you are feeling.  If you are feeling the baby move.  If you have had any abnormal symptoms, such as leaking fluid, bleeding, severe headaches, or abdominal cramping.  If you have any questions. Other tests or screenings that may be performed during your third trimester include:  Blood tests that check for low iron levels (anemia).  Fetal testing to check the health, activity level, and growth of the fetus. Testing is done if you have certain medical conditions or if there are problems during the pregnancy. FALSE LABOR You may feel small, irregular contractions that eventually go away. These are called Braxton Hicks contractions, or false labor. Contractions may last for hours, days, or even weeks before true labor sets in. If contractions come at regular intervals, intensify, or become painful, it is best to be seen by your caregiver.  SIGNS OF LABOR   Menstrual-like cramps.  Contractions that are 5 minutes apart or less.  Contractions that start on the top of the uterus and spread down to the lower abdomen and back.  A sense of increased pelvic pressure or back pain.  A watery or bloody mucus discharge that comes from the vagina. If you have any of these signs before the 37th week of pregnancy, call your caregiver right away. You need to go to the hospital to get checked immediately. HOME CARE INSTRUCTIONS   Avoid all smoking, herbs, alcohol, and unprescribed drugs. These chemicals affect the  formation and growth of the baby.  Follow your caregiver's instructions regarding medicine use. There are medicines that are either safe or unsafe to take during pregnancy.  Exercise only as directed by your caregiver. Experiencing uterine cramps is a good sign to stop exercising.  Continue to eat regular, healthy meals.  Wear a good support bra for breast tenderness.  Do not use hot tubs, steam rooms, or saunas.  Wear your seat belt at all times when driving.  Avoid raw meat, uncooked cheese, cat litter boxes, and soil used by cats. These carry germs that can cause birth defects in the baby.  Take your prenatal vitamins.  Try taking a stool softener (if your caregiver approves) if you develop constipation. Eat more high-fiber foods, such as fresh vegetables or fruit and whole grains. Drink plenty of fluids to keep your urine clear or  pale yellow.  Take warm sitz baths to soothe any pain or discomfort caused by hemorrhoids. Use hemorrhoid cream if your caregiver approves.  If you develop varicose veins, wear support hose. Elevate your feet for 15 minutes, 3-4 times a day. Limit salt in your diet.  Avoid heavy lifting, wear low heal shoes, and practice good posture.  Rest a lot with your legs elevated if you have leg cramps or low back pain.  Visit your dentist if you have not gone during your pregnancy. Use a soft toothbrush to brush your teeth and be gentle when you floss.  A sexual relationship may be continued unless your caregiver directs you otherwise.  Do not travel far distances unless it is absolutely necessary and only with the approval of your caregiver.  Take prenatal classes to understand, practice, and ask questions about the labor and delivery.  Make a trial run to the hospital.  Pack your hospital bag.  Prepare the baby's nursery.  Continue to go to all your prenatal visits as directed by your caregiver. SEEK MEDICAL CARE IF:  You are unsure if you are in  labor or if your water has broken.  You have dizziness.  You have mild pelvic cramps, pelvic pressure, or nagging pain in your abdominal area.  You have persistent nausea, vomiting, or diarrhea.  You have a bad smelling vaginal discharge.  You have pain with urination. SEEK IMMEDIATE MEDICAL CARE IF:   You have a fever.  You are leaking fluid from your vagina.  You have spotting or bleeding from your vagina.  You have severe abdominal cramping or pain.  You have rapid weight loss or gain.  You have shortness of breath with chest pain.  You notice sudden or extreme swelling of your face, hands, ankles, feet, or legs.  You have not felt your baby move in over an hour.  You have severe headaches that do not go away with medicine.  You have vision changes. Document Released: 12/17/2000 Document Revised: 12/28/2012 Document Reviewed: 02/24/2012 [facility] Patient Information 2015 [place], [place]. This information is not intended to replace advice given to you by your health care provider. Make sure you discuss any questions you have with your health care provider.

## 2014-05-23 NOTE — Progress Notes (Signed)
Low-risk OB appointment [id number] 3136w3d Estimated Date of Delivery: 07/15/14 BP 118/70 mmHg  Pulse 60  Wt 229 lb (103.874 kg)  LMP  (LMP Unknown)  BP, weight, and urine reviewed.  Refer to obstetrical flow sheet for FH & FHR.  Reports good fm.  Denies regular uc's, lof, vb, or uti s/s. Cold x ~1wk, getting better.  Reviewed ptl s/s, fkc, normal pn2 results. Plan:  Continue routine obstetrical care  F/U in 2wks for OB appointment

## 2014-06-06 ENCOUNTER — Ambulatory Visit (INDEPENDENT_AMBULATORY_CARE_PROVIDER_SITE_OTHER): Payer: Medicaid Other | Admitting: Advanced Practice Midwife

## 2014-06-06 ENCOUNTER — Encounter: Payer: Self-pay | Admitting: Advanced Practice Midwife

## 2014-06-06 VITALS — BP 100/60 | HR 92 | Wt 236.0 lb

## 2014-06-06 DIAGNOSIS — Z3493 Encounter for supervision of normal pregnancy, unspecified, third trimester: Secondary | ICD-10-CM

## 2014-06-06 DIAGNOSIS — Z331 Pregnant state, incidental: Secondary | ICD-10-CM

## 2014-06-06 DIAGNOSIS — Z1389 Encounter for screening for other disorder: Secondary | ICD-10-CM

## 2014-06-06 LAB — POCT URINALYSIS DIPSTICK
Blood, UA: NEGATIVE
Glucose, UA: NEGATIVE
Ketones, UA: NEGATIVE
Leukocytes, UA: NEGATIVE
Nitrite, UA: NEGATIVE

## 2014-06-06 NOTE — Progress Notes (Signed)
Pt denies any problems or concerns at this time.  

## 2014-06-06 NOTE — Progress Notes (Signed)
[id number] 3042w3d Estimated Date of Delivery: 07/15/14  Blood pressure 100/60, pulse 92, weight 236 lb (107.049 kg), unknown if currently breastfeeding.   BP weight and urine results all reviewed and noted.  Please refer to the obstetrical flow sheet for the fundal height and fetal heart rate documentation:  Patient reports good fetal movement, denies any bleeding and no rupture of membranes symptoms or regular contractions. Patient is without complaints. All questions were answered.  Plan:  Continued routine obstetrical care,   Follow up in 2 weeks for OB appointment, GBS

## 2014-06-29 ENCOUNTER — Encounter: Payer: Self-pay | Admitting: Advanced Practice Midwife

## 2014-06-29 ENCOUNTER — Ambulatory Visit (INDEPENDENT_AMBULATORY_CARE_PROVIDER_SITE_OTHER): Payer: Medicaid Other | Admitting: Advanced Practice Midwife

## 2014-06-29 VITALS — BP 120/76 | HR 96 | Wt 239.5 lb

## 2014-06-29 DIAGNOSIS — Z1389 Encounter for screening for other disorder: Secondary | ICD-10-CM

## 2014-06-29 DIAGNOSIS — Z3A37 37 weeks gestation of pregnancy: Secondary | ICD-10-CM

## 2014-06-29 DIAGNOSIS — Z3493 Encounter for supervision of normal pregnancy, unspecified, third trimester: Secondary | ICD-10-CM

## 2014-06-29 DIAGNOSIS — Z331 Pregnant state, incidental: Secondary | ICD-10-CM

## 2014-06-29 DIAGNOSIS — Z369 Encounter for antenatal screening, unspecified: Secondary | ICD-10-CM

## 2014-06-29 LAB — POCT URINALYSIS DIPSTICK
Blood, UA: 4
Glucose, UA: NEGATIVE
Ketones, UA: NEGATIVE
Leukocytes, UA: NEGATIVE
Nitrite, UA: NEGATIVE

## 2014-06-29 LAB — OB RESULTS CONSOLE GC/CHLAMYDIA
Chlamydia: NEGATIVE
Gonorrhea: NEGATIVE

## 2014-06-29 LAB — OB RESULTS CONSOLE GBS: GBS: NEGATIVE

## 2014-06-29 NOTE — Progress Notes (Signed)
[id number] [redacted]w[redacted]d Estimated Date of Delivery: 07/15/14  Blood pressure 120/76, pulse 96, weight 239 lb 8 oz (108.636 kg), unknown if currently breastfeeding.   BP weight and urine results all reviewed and noted.  Please refer to the obstetrical flow sheet for the fundal height and fetal heart rate documentation:  Patient reports good fetal movement, denies any bleeding and no rupture of membranes symptoms or regular contractions. Patient C/O vaginal irritation/itch for a few days that resolved, but now feels "dry"SSE:  Normal vaginal and scant amount of normal appearing DC.  All questions were answered.  Plan:  Continued routine obstetrical care, GBS today  Follow up in 1 weeks for OB appointment,

## 2014-06-29 NOTE — Progress Notes (Signed)
Pt thinks she may have a yeast infection. °

## 2014-06-29 NOTE — Addendum Note (Signed)
Addended by: Richardson Chiquito on: 06/29/2014 10:33 AM   Modules accepted: Orders

## 2014-06-30 LAB — GC/CHLAMYDIA PROBE AMP
Chlamydia trachomatis, NAA: NEGATIVE
Neisseria gonorrhoeae by PCR: NEGATIVE

## 2014-07-01 LAB — STREP GP B NAA+RFLX: Strep Gp B NAA+Rflx: NEGATIVE

## 2014-07-05 ENCOUNTER — Encounter (HOSPITAL_COMMUNITY): Payer: Self-pay | Admitting: Anesthesiology

## 2014-07-05 ENCOUNTER — Inpatient Hospital Stay (HOSPITAL_COMMUNITY)
Admission: AD | Admit: 2014-07-05 | Discharge: 2014-07-06 | DRG: 775 | Disposition: A | Discharge status: Home / Self Care | Payer: Medicaid Other | Source: Ambulatory Visit | Attending: Family Medicine | Admitting: Family Medicine

## 2014-07-05 ENCOUNTER — Encounter (HOSPITAL_COMMUNITY): Payer: Self-pay | Admitting: General Practice

## 2014-07-05 DIAGNOSIS — O99344 Other mental disorders complicating childbirth: Principal | ICD-10-CM | POA: Diagnosis present

## 2014-07-05 DIAGNOSIS — Z3A38 38 weeks gestation of pregnancy: Secondary | ICD-10-CM | POA: Diagnosis present

## 2014-07-05 DIAGNOSIS — F319 Bipolar disorder, unspecified: Secondary | ICD-10-CM | POA: Diagnosis present

## 2014-07-05 DIAGNOSIS — J45909 Unspecified asthma, uncomplicated: Secondary | ICD-10-CM | POA: Diagnosis present

## 2014-07-05 DIAGNOSIS — O9952 Diseases of the respiratory system complicating childbirth: Secondary | ICD-10-CM | POA: Diagnosis present

## 2014-07-05 DIAGNOSIS — IMO0001 Reserved for inherently not codable concepts without codable children: Secondary | ICD-10-CM

## 2014-07-05 DIAGNOSIS — Z3493 Encounter for supervision of normal pregnancy, unspecified, third trimester: Secondary | ICD-10-CM

## 2014-07-05 DIAGNOSIS — Z87891 Personal history of nicotine dependence: Secondary | ICD-10-CM

## 2014-07-05 DIAGNOSIS — R319 Hematuria, unspecified: Secondary | ICD-10-CM

## 2014-07-05 LAB — CBC
HCT: 31.6 % — ABNORMAL LOW (ref 36.0–46.0)
Hemoglobin: 10.8 g/dL — ABNORMAL LOW (ref 12.0–15.0)
MCH: 29.6 pg (ref 26.0–34.0)
MCHC: 34.2 g/dL (ref 30.0–36.0)
MCV: 86.6 fL (ref 78.0–100.0)
Platelets: 306 10*3/uL (ref 150–400)
RBC: 3.65 MIL/uL — ABNORMAL LOW (ref 3.87–5.11)
RDW: 12.7 % (ref 11.5–15.5)
WBC: 14.8 10*3/uL — ABNORMAL HIGH (ref 4.0–10.5)

## 2014-07-05 LAB — RPR: RPR Ser Ql: NONREACTIVE

## 2014-07-05 MED ORDER — FENTANYL 2.5 MCG/ML BUPIVACAINE 1/10 % EPIDURAL INFUSION (WH - ANES): 14.0000 mL/h | INTRAMUSCULAR | Status: DC | PRN

## 2014-07-05 MED ORDER — ZOLPIDEM TARTRATE 5 MG PO TABS: 5.0000 mg | ORAL_TABLET | Freq: Every evening | ORAL | Status: DC | PRN

## 2014-07-05 MED ORDER — BENZOCAINE-MENTHOL 20-0.5 % EX AERO
1.0000 "application " | INHALATION_SPRAY | CUTANEOUS | Status: DC | PRN
  Administered 2014-07-05: 1 via TOPICAL
  Filled 2014-07-05 (×2): qty 56

## 2014-07-05 MED ORDER — PRENATAL MULTIVITAMIN CH
1.0000 | ORAL_TABLET | Freq: Every day | ORAL | Status: DC
  Administered 2014-07-05 – 2014-07-06 (×2): 1 via ORAL
  Filled 2014-07-05 (×2): qty 1

## 2014-07-05 MED ORDER — WITCH HAZEL-GLYCERIN EX PADS: 1.0000 "application " | MEDICATED_PAD | CUTANEOUS | Status: DC | PRN

## 2014-07-05 MED ORDER — PHENYLEPHRINE 40 MCG/ML (10ML) SYRINGE FOR IV PUSH (FOR BLOOD PRESSURE SUPPORT)
80.0000 ug | PREFILLED_SYRINGE | INTRAVENOUS | Status: DC | PRN
  Filled 2014-07-05: qty 2
  Filled 2014-07-05: qty 20

## 2014-07-05 MED ORDER — ONDANSETRON HCL 4 MG PO TABS: 4.0000 mg | ORAL_TABLET | ORAL | Status: DC | PRN

## 2014-07-05 MED ORDER — OXYCODONE-ACETAMINOPHEN 5-325 MG PO TABS
1.0000 | ORAL_TABLET | ORAL | Status: DC | PRN
Start: 2014-07-05 — End: 2014-07-06

## 2014-07-05 MED ORDER — ONDANSETRON HCL 4 MG/2ML IJ SOLN: 4.0000 mg | INTRAMUSCULAR | Status: DC | PRN

## 2014-07-05 MED ORDER — ACETAMINOPHEN 325 MG PO TABS: 650.0000 mg | ORAL_TABLET | ORAL | Status: DC | PRN

## 2014-07-05 MED ORDER — OXYCODONE-ACETAMINOPHEN 5-325 MG PO TABS
2.0000 | ORAL_TABLET | ORAL | Status: DC | PRN
  Administered 2014-07-05 – 2014-07-06 (×5): 2 via ORAL
  Filled 2014-07-05 (×5): qty 2

## 2014-07-05 MED ORDER — LIDOCAINE HCL (PF) 1 % IJ SOLN
30.0000 mL | INTRAMUSCULAR | Status: DC | PRN
  Filled 2014-07-05: qty 30

## 2014-07-05 MED ORDER — LACTATED RINGERS IV SOLN
INTRAVENOUS | Status: DC
  Administered 2014-07-05: 02:00:00 via INTRAVENOUS

## 2014-07-05 MED ORDER — CITRIC ACID-SODIUM CITRATE 334-500 MG/5ML PO SOLN: 30.0000 mL | ORAL | Status: DC | PRN

## 2014-07-05 MED ORDER — OXYTOCIN 40 UNITS IN LACTATED RINGERS INFUSION - SIMPLE MED
62.5000 mL/h | INTRAVENOUS | Status: DC
  Filled 2014-07-05: qty 1000

## 2014-07-05 MED ORDER — TETANUS-DIPHTH-ACELL PERTUSSIS 5-2.5-18.5 LF-MCG/0.5 IM SUSP: 0.5000 mL | Freq: Once | INTRAMUSCULAR | Status: DC

## 2014-07-05 MED ORDER — EPHEDRINE 5 MG/ML INJ
10.0000 mg | INTRAVENOUS | Status: DC | PRN
  Filled 2014-07-05: qty 2

## 2014-07-05 MED ORDER — DIBUCAINE 1 % RE OINT: 1.0000 "application " | TOPICAL_OINTMENT | RECTAL | Status: DC | PRN

## 2014-07-05 MED ORDER — LACTATED RINGERS IV SOLN
500.0000 mL | INTRAVENOUS | Status: DC | PRN
  Administered 2014-07-05: 1000 mL via INTRAVENOUS

## 2014-07-05 MED ORDER — LANOLIN HYDROUS EX OINT: TOPICAL_OINTMENT | CUTANEOUS | Status: DC | PRN

## 2014-07-05 MED ORDER — IBUPROFEN 600 MG PO TABS
600.0000 mg | ORAL_TABLET | Freq: Four times a day (QID) | ORAL | Status: DC
  Administered 2014-07-05 – 2014-07-06 (×5): 600 mg via ORAL
  Filled 2014-07-05 (×6): qty 1

## 2014-07-05 MED ORDER — ONDANSETRON HCL 4 MG/2ML IJ SOLN: 4.0000 mg | Freq: Four times a day (QID) | INTRAMUSCULAR | Status: DC | PRN

## 2014-07-05 MED ORDER — OXYTOCIN BOLUS FROM INFUSION
500.0000 mL | INTRAVENOUS | Status: DC
  Administered 2014-07-05: 500 mL via INTRAVENOUS

## 2014-07-05 MED ORDER — FENTANYL 2.5 MCG/ML BUPIVACAINE 1/10 % EPIDURAL INFUSION (WH - ANES)
14.0000 mL/h | INTRAMUSCULAR | Status: DC | PRN
  Filled 2014-07-05: qty 125

## 2014-07-05 MED ORDER — DIPHENHYDRAMINE HCL 25 MG PO CAPS: 25.0000 mg | ORAL_CAPSULE | Freq: Four times a day (QID) | ORAL | Status: DC | PRN

## 2014-07-05 MED ORDER — SENNOSIDES-DOCUSATE SODIUM 8.6-50 MG PO TABS
2.0000 | ORAL_TABLET | ORAL | Status: DC
  Filled 2014-07-05: qty 2

## 2014-07-05 MED ORDER — PHENYLEPHRINE 40 MCG/ML (10ML) SYRINGE FOR IV PUSH (FOR BLOOD PRESSURE SUPPORT)
80.0000 ug | PREFILLED_SYRINGE | INTRAVENOUS | Status: DC | PRN
  Filled 2014-07-05: qty 2

## 2014-07-05 MED ORDER — DIPHENHYDRAMINE HCL 50 MG/ML IJ SOLN: 12.5000 mg | INTRAMUSCULAR | Status: DC | PRN

## 2014-07-05 MED ORDER — SIMETHICONE 80 MG PO CHEW: 80.0000 mg | CHEWABLE_TABLET | ORAL | Status: DC | PRN

## 2014-07-05 NOTE — H&P (Signed)
LABOR ADMISSION HISTORY AND PHYSICAL  [name] [name] is a 24 y.o. female G41P2002 with IUP at [redacted]w[redacted]d by [place] presenting for SOL with SROM :15pm. She reports +FM, + contractions, some bloody show.  She plans on bottle feeding. She request IUD for birth control.  Dating: By 67wk [place] --->  Estimated Date of Delivery: 07/15/14  Prenatal History/Complications: [facility]  Initiated Care at  21 weeks  FOB Darren Faucette, 24yo, dating, 3rd baby  Dating By 19 week [place]  Pap 2014- neg per pt  GC/CT Initial: -/- 36+wks:  Genetic Screen AFP normal  CF screen Prev neg per pt  Anatomic [place] Normal female  Flu vaccine 03/13/14  Tdap Recommended ~ 28wks  Glucose Screen  2 hr 82/130/100  GBS   Feed Preference breast  Contraception IUD  Circumcision Yes, at [facility]  Childbirth Classes declined  Pediatrician [facility]      Past Medical History: Past Medical History  Diagnosis Date  . Bacterial infection   . Umbilical hernia   . Renal stones   . Asthma   . Frequent headaches     occ migraines  . Bipolar disease during pregnancy   . Renal stones     Past Surgical History: Past Surgical History  Procedure Laterality Date  . Dilation and evacuation    . Wisdom tooth extraction    . Hernia repair    . Tubes in ears  09/2012    Obstetrical History: OB History    Gravida Para Term Preterm AB TAB SAB Ectopic Multiple Living   Social History: History   Social History  . Marital Status: Single    Spouse Name: N/A  . Number of Children: N/A  . Years of Education: N/A   Social History Main Topics  . Smoking status: Former Games developer  . Smokeless tobacco: Never Used     Comment: 12/2012  . Alcohol Use: No  . Drug Use: No  . Sexual Activity: Yes    Birth Control/ Protection: None   Other Topics Concern  . Not on file   Social History Narrative    Family History: Family History  Problem Relation Age of Onset  . Heart disease  Paternal Grandfather   . Heart disease Paternal Grandmother   . Heart disease Maternal Grandmother   . Diabetes Maternal Grandmother   . Heart disease Maternal Grandfather   . Diabetes Maternal Grandfather   . Other Sister     congenital heart defect; open heart surgery @ 24 year old  . Diabetes Maternal Uncle   . Hypertension Mother   . Asthma Son   . Asthma Son     Allergies: Allergies  Allergen Reactions  . Hydrocodone Nausea Only    Prescriptions prior to admission  Medication Sig Dispense Refill Last Dose  . calcium carbonate (TUMS - DOSED IN MG ELEMENTAL CALCIUM) 500 MG chewable tablet Chew 1 tablet by mouth daily.   Taking  . Prenatal Vit-Fe Fumarate-FA (PRENATAL MULTIVITAMIN) TABS tablet Take 1 tablet by mouth daily at 12 noon.   Taking    Review of Systems  All systems reviewed and negative except as stated in HPI  Physical Exam: General appearance: alert, cooperative and mild distress Head: Normocephalic and atraumatic. Lungs: normal work of breathing Heart: regular rate and rhythm Abdomen: Soft. Gravid.  She exhibits no distension and no mass.  Musculoskeletal: Normal range of motion.  Neurological: She is alert and oriented to person, place, and time.  Skin: Skin is warm and dry.  Psychiatric: She has a normal mood and affect. Her behavior is normal. Judgment and thought content normal.  Presentation: cephalic Fetal monitoringBaseline: 145 bpm, Variability: Good {> 6 bpm), Accelerations: None and Decelerations: Variable: mild Uterine activityFrequency: Every 2-4 minutes Dilation: 5 Effacement (%): 80 Station: -1 Exam by:: D Simpson RN   Prenatal labs: ABO, Rh: AB/--/-- (03/07 1126) Antibody: Negative (04/26 1031) Rubella:  Immune RPR: Non Reactive (04/26 1031)  HBsAg: Negative (03/07 1126)  HIV:   Non-reactive GBS: Negative (07/22 0000)  2 hr Glucola 82/130/100 Genetic screening normal Anatomy US normal  Prenatal Transfer Tool  Maternal  Diabetes: No Genetic Screening: Normal Maternal Ultrasounds/Referrals: Normal Fetal Ultrasounds or other Referrals:  None Maternal Substance Abuse:  No Significant Maternal Medications:  None Significant Maternal Lab Results: Lab values include: Group B Strep negative  No results found for this or any previous visit (from the past 24 hour(s)).  Patient Active Problem List   Diagnosis Date Noted  . Short interval between pregnancies complicating pregnancy, antepartum 03/13/2014  . Supervision of normal pregnancy 03/02/2014  . Hematuria 03/02/2014  . Acute sinusitis 03/30/2013  . Elevated blood pressure 03/30/2013    Assessment: [name] [name] is a 24 y.o. G3P2002 at 1230w4d here with SROM and in active labor.   Admit #Labor: Expectant management. #Pain: Plan for epidural #ID: GBS neg #MOC: IUD #MOF: Breast  Caryl AdaJazma Kamar Callender, DO 07/05/2014, 1:12 AM PGY-1, [facility]

## 2014-07-05 NOTE — Anesthesia Preprocedure Evaluation (Deleted)
Anesthesia Evaluation  Patient identified by MRN, date of birth, ID band Patient awake    Reviewed: Allergy & Precautions, Patient's Chart, lab work & pertinent test results  Airway Mallampati: III  TM Distance: >3 FB Neck ROM: Full    Dental no notable dental hx. (+) Teeth Intact   Pulmonary asthma , former smoker,  breath sounds clear to auscultation  Pulmonary exam normal       Cardiovascular negative cardio ROS Normal cardiovascular examRhythm:Regular Rate:Normal     Neuro/Psych  Headaches, PSYCHIATRIC DISORDERS Bipolar Disorder    GI/Hepatic Neg liver ROS, GERD-  ,  Endo/Other  Morbid obesity  Renal/GU Renal diseaseHx/o renal calculi  negative genitourinary   Musculoskeletal negative musculoskeletal ROS (+)   Abdominal (+) + obese,   Peds  Hematology  (+) anemia ,   Anesthesia Other Findings   Reproductive/Obstetrics (+) Pregnancy                             Anesthesia Physical Anesthesia Plan  ASA: III  Anesthesia Plan: Epidural   Post-op Pain Management:    Induction:   Airway Management Planned: Natural Airway  Additional Equipment:   Intra-op Plan:   Post-operative Plan:   Informed Consent: I have reviewed the patients History and Physical, chart, labs and discussed the procedure including the risks, benefits and alternatives for the proposed anesthesia with the patient or authorized representative who has indicated his/her understanding and acceptance.     Plan Discussed with: Anesthesiologist  Anesthesia Plan Comments:         Anesthesia Quick Evaluation

## 2014-07-06 ENCOUNTER — Telehealth: Payer: Self-pay | Admitting: Advanced Practice Midwife

## 2014-07-06 ENCOUNTER — Encounter (HOSPITAL_COMMUNITY): Payer: Self-pay | Admitting: *Deleted

## 2014-07-06 ENCOUNTER — Encounter: Payer: Medicaid Other | Admitting: Obstetrics & Gynecology

## 2014-07-06 LAB — TYPE AND SCREEN
ABO/RH(D): AB POS
Antibody Screen: NEGATIVE

## 2014-07-06 MED ORDER — DOCUSATE SODIUM 100 MG PO CAPS: 100.0000 mg | ORAL_CAPSULE | Freq: Two times a day (BID) | ORAL | Status: DC | PRN

## 2014-07-06 MED ORDER — IBUPROFEN 600 MG PO TABS: 600.0000 mg | ORAL_TABLET | Freq: Four times a day (QID) | ORAL | Status: DC

## 2014-07-06 NOTE — Discharge Instructions (Signed)

## 2014-07-06 NOTE — Discharge Summary (Signed)
Obstetric Discharge Summary Reason for Admission: onset of labor Prenatal Procedures: NST and ultrasound Intrapartum Procedures: spontaneous vaginal delivery Postpartum Procedures: none Complications-Operative and Postpartum: none  Delivery Note At 2:02 AM a viable female was delivered via Vaginal, Spontaneous Delivery (Presentation: Left Occiput Anterior). APGAR: 8, 9; weight pending. Placenta status: Intact, Spontaneous. Cord: 3 vessels with the following complications: None. Cord pH: None.  Anesthesia: None Episiotomy: None Lacerations: Periurethral(hemostatic) Est. Blood Loss (mL): 300   Upon arrival baby was already delivered. Nurses had clamped and cut the cord and placed infant on maternal abdomen. Placenta delivered intact with 3V cord. Vaginal canal and perineum was inspected and hemostatic. Pitocin was started and uterus massaged until bleeding slowed. Counts of sharps, instruments, and lap pads were all correct.   Hospital Course:  Active Problems:   Active labor   [name] FerrariJennifer L [name] is a 24 y.o. [id number] s/p NSVD.  Patient was admitted active labor with SROM.  She has postpartum course that was uncomplicated including no problems with ambulating, PO intake, urination, pain, or bleeding. The pt feels ready to go home and  will be discharged with outpatient follow-up.   Today: No acute events overnight.  Pt denies problems with ambulating, voiding or po intake.  She denies nausea or vomiting.  Pain is moderately controlled.  She has had flatus. She has not had bowel movement.  Lochia Minimal.  Plan for birth control is  IUD.  Method of Feeding: bottle  Physical Exam:  General: alert, cooperative and no distress Lochia: appropriate Uterine Fundus: firm DVT Evaluation: No evidence of DVT seen on physical exam. No cords or calf tenderness.  H/H: Lab Results  Component Value Date/Time   HGB 10.8* 07/05/2014 01:35 AM   HCT 31.6* 07/05/2014 01:35 AM   HCT 32.8*  05/02/2014 10:31 AM    Discharge Diagnoses: Term Pregnancy-delivered  Discharge Information: Date: 07/06/2014 Activity: pelvic rest Diet: routine  Medications: PNV, Ibuprofen and Colace Breast feeding:  No Condition: stable Instructions: refer to handout Discharge to: home       Discharge Instructions    Increase activity slowly    Complete by:  As directed             Medication List    TAKE these medications        albuterol 108 (90 BASE) MCG/ACT inhaler  Commonly known as:  PROVENTIL HFA;VENTOLIN HFA  Inhale 2 puffs into the lungs every 6 (six) hours as needed for wheezing or shortness of breath.     calcium carbonate 500 MG chewable tablet  Commonly known as:  TUMS - dosed in mg elemental calcium  Chew 1 tablet by mouth daily.     docusate sodium 100 MG capsule  Commonly known as:  COLACE  Take 1 capsule (100 mg total) by mouth 2 (two) times daily as needed.     ibuprofen 600 MG tablet  Commonly known as:  ADVIL,MOTRIN  Take 1 tablet (600 mg total) by mouth every 6 (six) hours.     prenatal multivitamin Tabs tablet  Take 1 tablet by mouth daily at 12 noon.         [name] BisNicholas Henchy Mccauley PA Student  07/06/2014,8:05AM

## 2014-07-06 NOTE — Progress Notes (Signed)
CLINICAL SOCIAL WORK MATERNAL/CHILD NOTE  Patient Details  Name: Boy Darlene Brozowski MRN: [id number] Date of Birth: 07/05/2015  Date:  07/06/2014  Clinical Social Worker Initiating Note:  Lucita Ferrara, LCSW Date/ Time Initiated:  07/06/14/1000     Child's Name:  Marijo Conception  Legal Guardian:  Venia Minks (mother) and Darren Faucette  Need for Interpreter:  None   Date of Referral:  07/05/14     Reason for Referral:  History of bipolar  Referral Source:  [facility]   Address:  [address] [place], [place] [address]  Phone number:  [phone]   Household Members:  Minor Children Kerrie Buffalo 08/10/13 and Son 01/07/10), Significant Other   Natural Supports (not living in the home):  Extended Family, Immediate Family. Per MOB, the FOB's family lives in the same neighborhood and continues to be very supportive/involved.    Professional Supports: None   Employment: Homemaker   Type of Work:   N/A  Education:    N/A  Pensions consultant:  Medicaid   Other Resources:  Physicist, medical , [place] Considerations Which May Impact Care:  None reported  Strengths:  Ability to meet basic needs , Pediatrician chosen , Home prepared for child    Risk Factors/Current Problems:   1)Mental Health Concerns: MOB presents with history of bipolar, but reported that she has not been on medications since 2011. MOB denied history of perinatal mood and anxiety disorders. She denied significant mental health symptoms during this pregnancy. No acute symptoms noted or observed during CSW assessment.   Cognitive State:  Able to Concentrate , Alert , Insightful , Linear Thinking , Goal Oriented    Mood/Affect:  Animated, Happy , Interested , Comfortable    CSW Assessment:  CSW received request for consult due to MOB presenting with a history of bipolar.  CSW initially met MOB after her last child was born in August 2015 for same reason.  In August 2015,  MOB denied any recent mental health concerns. Upon arrival to the room, MOB remembered CSW and expressed appreciation for the return visit.  FOB was also in the room for the assessment, but he participated minimally. He presented as supportive, but MOB was hyper-verbal and displayed slightly pressured speech that may have limited his ability to participate.  MOB displayed a full range in affect and was in a pleasant mood. She was able to engage in a linear and goal orientated thought process, and continues to be present with insight related to her mental health needs.   CSW provided supportive listening as MOB was guided to process her current thoughts and feelings as she transtions to the postpartum period. MOB smiled as she introduced the infant to [place].  She stated that she is feeling "great" and feels "happy" as she transitions to the postpartum period.  CSW inquired about updates/changes since her previous son was born. 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  MOB shared that she is beginning to consider going back to work part-time since she has noted that she may enjoy interacting with adults and feeling "productive". MOB recognized that she is productive at home, but that she has needed to re-define this concept as she stays at home to raise her children.  MOB continued to reflect upon her previous transition to the postpartum period, and discussed how easy the transition was since her oldest son transitioned well  and her second son was an "easy" baby.     MOB's mental health was reviewed during the visit. MOB confirmed that she has a history of bipolar, and was on medications until she became pregnancy with her first son (in 2011).  MOB stated that she has never experienced any postpartum depression, and has never re-started any of her bipolar medications.  Per MOB, her mental health continues to be stable, and denied any acute  symptoms during the pregnancy. She stated that she initially was overwhelmed when she learned of this pregnancy due to the short interval between pregnancies, but she reported that she feels prepared and supported.  Per MOB, she is considering discussing with her medical providers concerns about anxiety.  She stated that she has noted that she can sometimes experience "butterflies" in her stomach and chest pains while she is driving.  She reported that she is not aware of any triggers for these symptoms, but reported that she is curious to know that if she is experiencing anxiety.  MOB denied belief that her anxiety is impacting her sleep, and denied that she is experiencing anxious thoughts that are consuming large portions of her day.  MOB reported that her anxiety is not an acute problem, but is something she would like to address in the future.  CSW reviewed signs and symptoms of postpartum depression and anxiety, and MOB agreed to contact her medical provider if she notes these symptoms.   MOB and FOB denied additional questions, concerns, or needs at this time. MOB expressed appreciation for the visit and agreed to contact CSW if needs arise.  CSW Plan/Description:   1)Patient/Family Education: Perinatal mood and anxiety disorders 2)No Further Intervention Required/No Barriers to Discharge    Sharyl Nimrod 07/06/2014, 12:01 PM

## 2014-07-07 ENCOUNTER — Telehealth: Payer: Self-pay | Admitting: Obstetrics & Gynecology

## 2014-07-07 MED ORDER — OXYCODONE-ACETAMINOPHEN 5-325 MG PO TABS: 1.0000 | ORAL_TABLET | ORAL | Status: DC | PRN

## 2014-07-07 NOTE — Telephone Encounter (Signed)
Pt came into the office this am requesting pain medication for pain in groin and back, vaginal delivery 07/05/2014. Pt states Motrin not helping. Please advise.

## 2014-08-14 ENCOUNTER — Encounter (HOSPITAL_COMMUNITY): Payer: Self-pay | Admitting: *Deleted

## 2014-08-14 ENCOUNTER — Emergency Department (HOSPITAL_COMMUNITY)
Admission: EM | Admit: 2014-08-14 | Discharge: 2014-08-14 | Disposition: A | Discharge status: Home / Self Care | Payer: Medicaid Other | Attending: Emergency Medicine | Admitting: Emergency Medicine

## 2014-08-14 DIAGNOSIS — Z8679 Personal history of other diseases of the circulatory system: Secondary | ICD-10-CM | POA: Insufficient documentation

## 2014-08-14 DIAGNOSIS — Y9389 Activity, other specified: Secondary | ICD-10-CM | POA: Diagnosis not present

## 2014-08-14 DIAGNOSIS — Z8659 Personal history of other mental and behavioral disorders: Secondary | ICD-10-CM | POA: Insufficient documentation

## 2014-08-14 DIAGNOSIS — J45909 Unspecified asthma, uncomplicated: Secondary | ICD-10-CM | POA: Diagnosis not present

## 2014-08-14 DIAGNOSIS — Z23 Encounter for immunization: Secondary | ICD-10-CM | POA: Insufficient documentation

## 2014-08-14 DIAGNOSIS — Z8619 Personal history of other infectious and parasitic diseases: Secondary | ICD-10-CM | POA: Diagnosis not present

## 2014-08-14 DIAGNOSIS — W01198A Fall on same level from slipping, tripping and stumbling with subsequent striking against other object, initial encounter: Secondary | ICD-10-CM | POA: Insufficient documentation

## 2014-08-14 DIAGNOSIS — Z791 Long term (current) use of non-steroidal anti-inflammatories (NSAID): Secondary | ICD-10-CM | POA: Insufficient documentation

## 2014-08-14 DIAGNOSIS — Z87442 Personal history of urinary calculi: Secondary | ICD-10-CM | POA: Diagnosis not present

## 2014-08-14 DIAGNOSIS — Z87891 Personal history of nicotine dependence: Secondary | ICD-10-CM | POA: Insufficient documentation

## 2014-08-14 DIAGNOSIS — Z79899 Other long term (current) drug therapy: Secondary | ICD-10-CM | POA: Insufficient documentation

## 2014-08-14 DIAGNOSIS — S41111A Laceration without foreign body of right upper arm, initial encounter: Secondary | ICD-10-CM | POA: Diagnosis not present

## 2014-08-14 DIAGNOSIS — S40021A Contusion of right upper arm, initial encounter: Secondary | ICD-10-CM | POA: Insufficient documentation

## 2014-08-14 DIAGNOSIS — Y999 Unspecified external cause status: Secondary | ICD-10-CM | POA: Diagnosis not present

## 2014-08-14 DIAGNOSIS — Y9289 Other specified places as the place of occurrence of the external cause: Secondary | ICD-10-CM | POA: Insufficient documentation

## 2014-08-14 DIAGNOSIS — Z8719 Personal history of other diseases of the digestive system: Secondary | ICD-10-CM | POA: Diagnosis not present

## 2014-08-14 DIAGNOSIS — S4991XA Unspecified injury of right shoulder and upper arm, initial encounter: Secondary | ICD-10-CM | POA: Diagnosis present

## 2014-08-14 MED ORDER — IBUPROFEN 600 MG PO TABS: 600.0000 mg | ORAL_TABLET | Freq: Four times a day (QID) | ORAL | Status: DC | PRN

## 2014-08-14 MED ORDER — LIDOCAINE-EPINEPHRINE (PF) 2 %-1:200000 IJ SOLN
20.0000 mL | Freq: Once | INTRAMUSCULAR | Status: AC
  Administered 2014-08-14: 20 mL
  Filled 2014-08-14: qty 20

## 2014-08-14 MED ORDER — TETANUS-DIPHTH-ACELL PERTUSSIS 5-2.5-18.5 LF-MCG/0.5 IM SUSP
0.5000 mL | Freq: Once | INTRAMUSCULAR | Status: AC
  Administered 2014-08-14: 0.5 mL via INTRAMUSCULAR
  Filled 2014-08-14: qty 0.5

## 2014-08-14 MED ORDER — IBUPROFEN 400 MG PO TABS
600.0000 mg | ORAL_TABLET | Freq: Once | ORAL | Status: AC
  Administered 2014-08-14: 600 mg via ORAL
  Filled 2014-08-14: qty 2

## 2014-08-14 NOTE — ED Provider Notes (Signed)
CSN: [id number]     Arrival date & time 08/14/14  0750 History   First MD Initiated Contact with Patient 08/14/14 0825     Chief Complaint  Patient presents with  . Extremity Laceration  . Fall     (Consider location/radiation/quality/duration/timing/severity/associated sxs/prior Treatment) The history is provided by the patient.     Pt presents with laceration and bruising to the right upper arm that occurred this morning when she was attempting to attach a trailer hitch, stepped on it and slipped because it was wet from the rain.  She fell and cut her arm on something on the trailer.  Denies any other injury.  Denies weakness or numbness of the arm.  She did not hit her head or lose consciousness.  She is not on blood thinners.  Not immunocompromised.  Unsure of last tetanus vaccination.  She recently had a baby one month ago, no concerns with this currently.  She is not breastfeeding.   Past Medical History  Diagnosis Date  . Bacterial infection   . Umbilical hernia   . Renal stones   . Asthma   . Frequent headaches     occ migraines  . Bipolar disease during pregnancy   . Renal stones    Past Surgical History  Procedure Laterality Date  . Dilation and evacuation    . Wisdom tooth extraction    . Hernia repair    . Tubes in ears  09/2012   Family History  Problem Relation Age of Onset  . Heart disease Paternal Grandfather   . Heart disease Paternal Grandmother   . Heart disease Maternal Grandmother   . Diabetes Maternal Grandmother   . Heart disease Maternal Grandfather   . Diabetes Maternal Grandfather   . Other Sister     congenital heart defect; open heart surgery @ 24 year old  . Diabetes Maternal Uncle   . Hypertension Mother   . Asthma Son   . Asthma Son    History  Substance Use Topics  . Smoking status: Former Games developer  . Smokeless tobacco: Never Used     Comment: 12/2012  . Alcohol Use: No   OB History    Gravida Para Term Preterm AB TAB SAB Ectopic  Multiple Living   3 3 3       0 2     Review of Systems  Constitutional: Negative for fever and chills.  HENT: Negative for facial swelling.   Cardiovascular: Negative for chest pain.  Gastrointestinal: Negative for abdominal pain.  Musculoskeletal: Negative for arthralgias, gait problem and neck pain.  Skin: Positive for wound.  Allergic/Immunologic: Negative for immunocompromised state.  Neurological: Negative for syncope, weakness, numbness and headaches.  Psychiatric/Behavioral: Positive for self-injury (accidental).      Allergies  Hydrocodone  Home Medications   Prior to Admission medications   Medication Sig Start Date End Date Taking? Authorizing Provider  albuterol (PROVENTIL HFA;VENTOLIN HFA) 108 (90 BASE) MCG/ACT inhaler Inhale 2 puffs into the lungs every 6 (six) hours as needed for wheezing or shortness of breath.    Historical Provider, MD  calcium carbonate (TUMS - DOSED IN MG ELEMENTAL CALCIUM) 500 MG chewable tablet Chew 1 tablet by mouth daily.    Historical Provider, MD  docusate sodium (COLACE) 100 MG capsule Take 1 capsule (100 mg total) by mouth 2 (two) times daily as needed. 07/06/14   Pincus Large, DO  ibuprofen (ADVIL,MOTRIN) 600 MG tablet Take 1 tablet (600 mg total) by mouth every  6 (six) hours. 07/06/14   Pincus Large, DO  oxyCODONE-acetaminophen (PERCOCET/ROXICET) 5-325 MG per tablet Take 1 tablet by mouth every 4 (four) hours as needed for severe pain. 07/07/14   Lazaro Arms, MD  Prenatal Vit-Fe Fumarate-FA (PRENATAL MULTIVITAMIN) TABS tablet Take 1 tablet by mouth daily at 12 noon.    Historical Provider, MD   BP 131/91 mmHg  Pulse 89  Temp(Src) 98.4 F (36.9 C) (Oral)  Resp 18  SpO2 97% Physical Exam  Constitutional: She appears well-developed and well-nourished. No distress.  HENT:  Head: Normocephalic and atraumatic.  Neck: Neck supple.  Pulmonary/Chest: Effort normal.  Musculoskeletal:  Right posteriomedial upper arm with v-shaped  laceration and surrounding ecchymosis.  Hemostatic.  Right arm with full AROM, no bony tenderness, no swelling or pain involving the joints. Distal pulses and sensation intact.   Neurological: She is alert.  Skin: She is not diaphoretic.  Nursing note and vitals reviewed.   ED Course  Procedures (including critical care time) Labs Review Labs Reviewed - No data to display  Imaging Review No results found.   EKG Interpretation None       LACERATION REPAIR Performed by: Trixie Dredge Authorized by: Trixie Dredge Consent: Verbal consent obtained. Risks and benefits: risks, benefits and alternatives were discussed Consent given by: patient Patient identity confirmed: provided demographic data Prepped and Draped in normal sterile fashion Wound explored  Laceration Location: right upper arm  Laceration Length: 12 cm  No Foreign Bodies seen or palpated  Anesthesia: local infiltration  Local anesthetic: lidocaine 1% with epinephrine  Anesthetic total: 10 ml  Irrigation method: syringe Amount of cleaning: standard  Skin closure: 4-0 ethilon  Number of sutures: 11  Technique: simple interrupted  Patient tolerance: Patient tolerated the procedure well with no immediate complications.   MDM   Final diagnoses:  Laceration of right upper arm, initial encounter    Afebrile, nontoxic patient with laceration and bruising to right upper arm.  Neurovascularly intact.  No bony tenderness.   D/C home with wound care instructions, ibuprofen, PCP follow up in 10 days for wound check/suture removal.   Discussed result, findings, treatment, and follow up  with patient.  Pt given return precautions.  Pt verbalizes understanding and agrees with plan.         Trixie Dredge, PA-C 08/14/14 1003  Tilden Fossa, MD 08/14/14 1019

## 2014-08-14 NOTE — ED Notes (Signed)
Patient slipped and fell and has laceration to the back of the right upper arm.  No other injuries.

## 2014-08-14 NOTE — Discharge Instructions (Signed)
Read the information below.  You may return to the Emergency Department at any time for worsening condition or any new symptoms that concern you.  If you develop redness, swelling, pus draining from the wound, or fevers greater than 100.4, return to the ER immediately for a recheck.   ° ° °Laceration Care, Adult °A laceration is a cut or lesion that goes through all layers of the skin and into the tissue just beneath the skin. °TREATMENT  °Some lacerations may not require closure. Some lacerations may not be able to be closed due to an increased risk of infection. It is important to see your caregiver as soon as possible after an injury to minimize the risk of infection and maximize the opportunity for successful closure. °If closure is appropriate, pain medicines may be given, if needed. The wound will be cleaned to help prevent infection. Your caregiver will use stitches (sutures), staples, wound glue (adhesive), or skin adhesive strips to repair the laceration. These tools bring the skin edges together to allow for faster healing and a better cosmetic outcome. However, all wounds will heal with a scar. Once the wound has healed, scarring can be minimized by covering the wound with sunscreen during the day for 1 full year. °HOME CARE INSTRUCTIONS  °For sutures or staples: °· Keep the wound clean and dry. °· If you were given a bandage (dressing), you should change it at least once a day. Also, change the dressing if it becomes wet or dirty, or as directed by your caregiver. °· Wash the wound with soap and water 2 times a day. Rinse the wound off with water to remove all soap. Pat the wound dry with a clean towel. °· After cleaning, apply a thin layer of the antibiotic ointment as recommended by your caregiver. This will help prevent infection and keep the dressing from sticking. °· You may shower as usual after the first 24 hours. Do not soak the wound in water until the sutures are removed. °· Only take  over-the-counter or prescription medicines for pain, discomfort, or fever as directed by your caregiver. °· Get your sutures or staples removed as directed by your caregiver. °For skin adhesive strips: °· Keep the wound clean and dry. °· Do not get the skin adhesive strips wet. You may bathe carefully, using caution to keep the wound dry. °· If the wound gets wet, pat it dry with a clean towel. °· Skin adhesive strips will fall off on their own. You may trim the strips as the wound heals. Do not remove skin adhesive strips that are still stuck to the wound. They will fall off in time. °For wound adhesive: °· You may briefly wet your wound in the shower or bath. Do not soak or scrub the wound. Do not swim. Avoid periods of heavy perspiration until the skin adhesive has fallen off on its own. After showering or bathing, gently pat the wound dry with a clean towel. °· Do not apply liquid medicine, cream medicine, or ointment medicine to your wound while the skin adhesive is in place. This may loosen the film before your wound is healed. °· If a dressing is placed over the wound, be careful not to apply tape directly over the skin adhesive. This may cause the adhesive to be pulled off before the wound is healed. °· Avoid prolonged exposure to sunlight or tanning lamps while the skin adhesive is in place. Exposure to ultraviolet light in the first year will darken the   scar. °· The skin adhesive will usually remain in place for 5 to 10 days, then naturally fall off the skin. Do not pick at the adhesive film. °You may need a tetanus shot if: °· You cannot remember when you had your last tetanus shot. °· You have never had a tetanus shot. °If you get a tetanus shot, your arm may swell, get red, and feel warm to the touch. This is common and not a problem. If you need a tetanus shot and you choose not to have one, there is a rare chance of getting tetanus. Sickness from tetanus can be serious. °SEEK MEDICAL CARE IF:  °· You  have redness, swelling, or increasing pain in the wound. °· You see a red line that goes away from the wound. °· You have yellowish-white fluid (pus) coming from the wound. °· You have a fever. °· You notice a bad smell coming from the wound or dressing. °· Your wound breaks open before or after sutures have been removed. °· You notice something coming out of the wound such as wood or glass. °· Your wound is on your hand or foot and you cannot move a finger or toe. °SEEK IMMEDIATE MEDICAL CARE IF:  °· Your pain is not controlled with prescribed medicine. °· You have severe swelling around the wound causing pain and numbness or a change in color in your arm, hand, leg, or foot. °· Your wound splits open and starts bleeding. °· You have worsening numbness, weakness, or loss of function of any joint around or beyond the wound. °· You develop painful lumps near the wound or on the skin anywhere on your body. °MAKE SURE YOU:  °· Understand these instructions. °· Will watch your condition. °· Will get help right away if you are not doing well or get worse. °Document Released: 12/23/2004 Document Revised: 03/17/2011 Document Reviewed: 06/18/2010 °ExitCare® Patient Information ©2015 [employer]. This information is not intended to replace advice given to you by your health care provider. Make sure you discuss any questions you have with your health care provider. ° °

## 2014-08-14 NOTE — ED Notes (Signed)
With lac to left upper arm lac to under side of arm. Pt fell onto trailer while trying to hook trailer to truck. Good cms. Bleeding controlled. Adipose tissue showing. Pt updated on poc

## 2014-08-16 ENCOUNTER — Ambulatory Visit: Payer: Medicaid Other | Admitting: Women's Health

## 2014-08-21 ENCOUNTER — Encounter: Payer: Self-pay | Admitting: Women's Health

## 2014-08-21 ENCOUNTER — Ambulatory Visit: Payer: Medicaid Other | Admitting: Women's Health

## 2014-08-24 ENCOUNTER — Ambulatory Visit: Payer: Medicaid Other | Admitting: Advanced Practice Midwife

## 2014-08-29 ENCOUNTER — Encounter: Payer: Self-pay | Admitting: Women's Health

## 2014-08-29 ENCOUNTER — Ambulatory Visit (INDEPENDENT_AMBULATORY_CARE_PROVIDER_SITE_OTHER): Payer: Medicaid Other | Admitting: Women's Health

## 2014-08-29 DIAGNOSIS — R319 Hematuria, unspecified: Secondary | ICD-10-CM

## 2014-08-29 DIAGNOSIS — R635 Abnormal weight gain: Secondary | ICD-10-CM

## 2014-08-29 NOTE — Patient Instructions (Signed)
NO SEX UNTIL AFTER YOU GET YOUR BIRTH CONTROL  Levonorgestrel intrauterine device (IUD) What is this medicine? LEVONORGESTREL IUD (LEE voe nor jes trel) is a contraceptive (birth control) device. The device is placed inside the uterus by a healthcare professional. It is used to prevent pregnancy and can also be used to treat heavy bleeding that occurs during your period. Depending on the device, it can be used for 3 to 5 years. This medicine may be used for other purposes; ask your health care provider or pharmacist if you have questions. COMMON BRAND NAME(S): LILETTA, Mirena, Skyla What should I tell my health care provider before I take this medicine? They need to know if you have any of these conditions: -abnormal Pap smear -cancer of the breast, uterus, or cervix -diabetes -endometritis -genital or pelvic infection now or in the past -have more than one sexual partner or your partner has more than one partner -heart disease -history of an ectopic or tubal pregnancy -immune system problems -IUD in place -liver disease or tumor -problems with blood clots or take blood-thinners -use intravenous drugs -uterus of unusual shape -vaginal bleeding that has not been explained -an unusual or allergic reaction to levonorgestrel, other hormones, silicone, or polyethylene, medicines, foods, dyes, or preservatives -pregnant or trying to get pregnant -breast-feeding How should I use this medicine? This device is placed inside the uterus by a health care professional. Talk to your pediatrician regarding the use of this medicine in children. Special care may be needed. Overdosage: If you think you have taken too much of this medicine contact a poison control center or emergency room at once. NOTE: This medicine is only for you. Do not share this medicine with others. What if I miss a dose? This does not apply. What may interact with this medicine? Do not take this medicine with any of the  following medications: -amprenavir -bosentan -fosamprenavir This medicine may also interact with the following medications: -aprepitant -barbiturate medicines for inducing sleep or treating seizures -bexarotene -griseofulvin -medicines to treat seizures like carbamazepine, ethotoin, felbamate, oxcarbazepine, phenytoin, topiramate -modafinil -pioglitazone -rifabutin -rifampin -rifapentine -some medicines to treat HIV infection like atazanavir, indinavir, lopinavir, nelfinavir, tipranavir, ritonavir -St. John's wort -warfarin This list may not describe all possible interactions. Give your health care provider a list of all the medicines, herbs, non-prescription drugs, or dietary supplements you use. Also tell them if you smoke, drink alcohol, or use illegal drugs. Some items may interact with your medicine. What should I watch for while using this medicine? Visit your doctor or health care professional for regular check ups. See your doctor if you or your partner has sexual contact with others, becomes HIV positive, or gets a sexual transmitted disease. This product does not protect you against HIV infection (AIDS) or other sexually transmitted diseases. You can check the placement of the IUD yourself by reaching up to the top of your vagina with clean fingers to feel the threads. Do not pull on the threads. It is a good habit to check placement after each menstrual period. Call your doctor right away if you feel more of the IUD than just the threads or if you cannot feel the threads at all. The IUD may come out by itself. You may become pregnant if the device comes out. If you notice that the IUD has come out use a backup birth control method like condoms and call your health care provider. Using tampons will not change the position of the IUD and are   okay to use during your period. What side effects may I notice from receiving this medicine? Side effects that you should report to your  doctor or health care professional as soon as possible: -allergic reactions like skin rash, itching or hives, swelling of the face, lips, or tongue -fever, flu-like symptoms -genital sores -high blood pressure -no menstrual period for 6 weeks during use -pain, swelling, warmth in the leg -pelvic pain or tenderness -severe or sudden headache -signs of pregnancy -stomach cramping -sudden shortness of breath -trouble with balance, talking, or walking -unusual vaginal bleeding, discharge -yellowing of the eyes or skin Side effects that usually do not require medical attention (report to your doctor or health care professional if they continue or are bothersome): -acne -breast pain -change in sex drive or performance -changes in weight -cramping, dizziness, or faintness while the device is being inserted -headache -irregular menstrual bleeding within first 3 to 6 months of use -nausea This list may not describe all possible side effects. Call your doctor for medical advice about side effects. You may report side effects to FDA at 1-800-FDA-1088. Where should I keep my medicine? This does not apply. NOTE: This sheet is a summary. It may not cover all possible information. If you have questions about this medicine, talk to your doctor, pharmacist, or health care provider.  2015, Elsevier/Gold Standard. (2011-01-23 13:54:04)  

## 2014-08-29 NOTE — Progress Notes (Signed)
Patient ID: [name] [name], female   DOB: 09-25-1990, 24 y.o.   MRN: [id number] Subjective:    [name] [name] is a 24 y.o. [id number] Caucasian female who presents for a postpartum visit. She is 7 weeks postpartum following a spontaneous vaginal delivery at 38.4 gestational weeks. Anesthesia: none. I have fully reviewed the prenatal and intrapartum course. Postpartum course has been uncomplicated. Baby's course has been uncomplicated. Baby is feeding by bottle. Bleeding no bleeding. Bowel function is normal. Bladder function is normal. Patient is sexually active. Last sexual activity: 08/28/14. Contraception method is condoms and wants IUD. Postpartum depression screening: equivocal. Score 11. Denies SI/HI/II, doing well.  Last pap 2014 in [facility] and was normal. Has a bump on cx, was told by previous ob/gyn it was a cyst and may go away on its own. Has had chronic hematuria x 4 years- has never seen urology. Is concerned about weight gain. Was at 239lb at end of pregnancy, got down to 213lb after birth, then was up to 217lb on Fri when she went to [place] to have sutures from arm removed (fell and had lac on Rt upper arm w/ 11 stitches), now up to 224lb today. Is eating lots of fruits/veggies, meats- rarely any carbs, is active.   The following portions of the patient's history were reviewed and updated as appropriate: allergies, current medications, past medical history, past surgical history and problem list.  Review of Systems Pertinent items are noted in HPI.   Filed Vitals:   08/29/14 1426  BP: 120/64  Pulse: 84  Height:  (1.676 m)  Weight: 224 lb 8 oz (101.833 kg)   Patient's last menstrual period was 08/15/2014.  Objective:   General:  alert, cooperative and no distress   Breasts:  deferred, no complaints  Lungs: clear to auscultation bilaterally  Heart:  regular rate and rhythm  Abdomen: soft, nontender   Vulva: normal  Vagina: normal vagina  Cervix:  Closed, feel ~1cm cyst at  ~2-3o'clock  Corpus: Well-involuted  Adnexa:  Non-palpable  Rectal Exam: No hemorrhoids        Assessment:   Postpartum exam 7 wks s/p SVB Bottlefeeding Depression screening Contraception counseling  Chronic hematuria Weight gain after pregnancy Probable nabothian cyst- will evaluate further at IUD insertion  Plan:  Will check TSH today d/t concern over weight gain Order placed for referral to urology for chronic hematuria Contraception: abstinence until IUD placement 9/6 Follow up in: 9/6  for IUD insertion or earlier if needed  Marge Duncans CNM, [facility] 08/29/2014 2:58 PM

## 2014-08-30 LAB — TSH: TSH: 2.17 u[IU]/mL (ref 0.450–4.500)

## 2014-09-06 ENCOUNTER — Telehealth: Payer: Self-pay | Admitting: *Deleted

## 2014-09-06 NOTE — Telephone Encounter (Signed)
Pt informed of normal TSH from last week.  Pt also inquired about referral to urology, I informed her it has been done and recommended she wait a couple more days and if she has not heard from them to give that office a call to try to get her appointment scheduled.  Pt verbalized understanding.

## 2014-09-12 ENCOUNTER — Ambulatory Visit: Payer: Medicaid Other | Admitting: Women's Health

## 2014-09-14 ENCOUNTER — Ambulatory Visit: Payer: Medicaid Other | Admitting: Advanced Practice Midwife

## 2014-09-15 ENCOUNTER — Ambulatory Visit (INDEPENDENT_AMBULATORY_CARE_PROVIDER_SITE_OTHER): Payer: Medicaid Other | Admitting: Obstetrics and Gynecology

## 2014-09-15 ENCOUNTER — Encounter: Payer: Self-pay | Admitting: Obstetrics and Gynecology

## 2014-09-15 VITALS — BP 110/60 | Ht 65.0 in

## 2014-09-15 DIAGNOSIS — Z30014 Encounter for initial prescription of intrauterine contraceptive device: Secondary | ICD-10-CM

## 2014-09-15 DIAGNOSIS — Z3200 Encounter for pregnancy test, result unknown: Secondary | ICD-10-CM

## 2014-09-15 DIAGNOSIS — Z3202 Encounter for pregnancy test, result negative: Secondary | ICD-10-CM | POA: Diagnosis not present

## 2014-09-15 LAB — POCT URINE PREGNANCY: PREG TEST UR: NEGATIVE

## 2014-09-15 NOTE — Progress Notes (Signed)
Patient ID: [name] [name], female   DOB: 1990/06/22, 24 y.o.   MRN: [id number]   [facility] Visit  Patient name: [name] [name] MRN [id number]  Date of birth: 1990/12/18  CC & HPI:  [name] [name] is a 24 y.o. female presenting today for Mirena insertion. Pt reports Hx of IUD placements for approximately 7 years. Pt is currently on her period and denies any other complaints at this time.  ROS:  10 Systems reviewed and all are negative for acute change except as noted in the HPI.  Pertinent History Reviewed:   Reviewed: Significant for spontaneous vaginal delivery at 38.4 gestational weeks.  Medical         Past Medical History  Diagnosis Date  . Bacterial infection   . Umbilical hernia   . Renal stones   . Asthma   . Frequent headaches     occ migraines  . Bipolar disease during pregnancy   . Renal stones                               Surgical Hx:    Past Surgical History  Procedure Laterality Date  . Dilation and evacuation    . Wisdom tooth extraction    . Hernia repair    . Tubes in ears  09/2012   Medications: Reviewed & Updated - see associated section                       Current outpatient prescriptions:  .  albuterol (PROVENTIL HFA;VENTOLIN HFA) 108 (90 BASE) MCG/ACT inhaler, Inhale 2 puffs into the lungs every 6 (six) hours as needed for wheezing or shortness of breath., Disp: , Rfl:  .  calcium carbonate (TUMS - DOSED IN MG ELEMENTAL CALCIUM) 500 MG chewable tablet, Chew 1 tablet by mouth as needed. , Disp: , Rfl:  .  ibuprofen (ADVIL,MOTRIN) 600 MG tablet, Take 1 tablet (600 mg total) by mouth every 6 (six) hours as needed for mild pain or moderate pain. (Patient taking differently: Take 600 mg by mouth as needed for mild pain or moderate pain. ), Disp: 15 tablet, Rfl: 0   Social History: Reviewed -  reports that she has quit smoking. Her smoking use included Cigarettes. She has never used smokeless tobacco.  Objective Findings:  Vitals:  Blood pressure 110/60, height 5\' 5"  (1.651 m), last menstrual period 09/11/2014, not currently breastfeeding.  Physical Examination: General appearance - alert, well appearing, and in no distress and oriented to person, place, and time Mental status - alert, oriented to person, place, and time, normal mood, behavior, speech, dress, motor activity, and thought processes Pelvic - normal external genitalia, vulva, vagina, cervix, uterus and adnexa, VULVA: normal appearing vulva with no masses, tenderness or lesions, VAGINA: normal appearing vagina with normal color and discharge, no lesions, CERVIX: normal appearing cervix without discharge or lesions, UTERUS: uterus is normal size (7cm), shape, consistency and nontender, ADNEXA: normal adnexa in size, nontender and no masses    GYNECOLOGY CLINIC PROCEDURE NOTE  [name] [name] is a 24 y.o. [id number] here for MirenaIUD insertion.  No GYN concerns.  Last pap smear was on not on record.  IUD Insertion Procedure Note Patient identified, informed consent performed, consent signed.   Discussed risks of irregular bleeding, cramping, infection, malpositioning or misplacement of the IUD outside the uterus which may require further procedure such as laparoscopy. Time out  was performed.  Urine pregnancy test negative.  Speculum placed in the vagina.  Cervix visualized.  Cleaned with Betadine x 2.  Grasped anteriorly with a single tooth tenaculum.   Uterus sounded to 7 cm.   IUD placed per manufacturer's recommendations.  Strings trimmed to 3 cm. Tenaculum was removed, good hemostasis noted.  Patient tolerated procedure well.  Transvaginal Ultrasound was not used to confirm IUD position  Patient was given post-procedure instructions.  She was advised to have backup contraception for one week.  Patient was also asked to check IUD strings periodically.  Follow up in 4 weeks for IUD check.    Tilda Burrow, MD Attending Obstetrician & Gynecologist Family  [facility] [place], [facility]  . Assessment & Plan:   A:  1. IUD insert for contr management  P:  1. IUD placement      This chart was SCRIBED for Christin Bach, MD by Marica Otter, ED Scribe. This patient was seen in room 2, and the patient's care was started at 11:08 AM. I personally performed the services described in this documentation, which was SCRIBED in my presence. The recorded information has been reviewed and considered accurate. It has been edited as necessary during review. Tilda Burrow, MD

## 2014-10-13 ENCOUNTER — Ambulatory Visit: Payer: Medicaid Other | Admitting: Obstetrics and Gynecology

## 2015-09-14 ENCOUNTER — Ambulatory Visit
Admission: RE | Admit: 2015-09-14 | Discharge: 2015-09-14 | Disposition: A | Discharge status: Home / Self Care | Payer: Medicaid Other | Source: Ambulatory Visit | Attending: *Deleted | Admitting: *Deleted

## 2015-09-14 ENCOUNTER — Other Ambulatory Visit: Payer: Self-pay | Admitting: *Deleted

## 2015-09-14 DIAGNOSIS — G8929 Other chronic pain: Secondary | ICD-10-CM

## 2015-09-14 DIAGNOSIS — M5416 Radiculopathy, lumbar region: Secondary | ICD-10-CM

## 2015-09-14 DIAGNOSIS — M5441 Lumbago with sciatica, right side: Secondary | ICD-10-CM

## 2016-05-13 ENCOUNTER — Inpatient Hospital Stay (HOSPITAL_COMMUNITY): Admit: 2016-05-13 | Payer: Self-pay

## 2016-09-09 ENCOUNTER — Encounter (HOSPITAL_COMMUNITY): Payer: Self-pay | Admitting: Emergency Medicine

## 2016-09-09 ENCOUNTER — Emergency Department (HOSPITAL_COMMUNITY): Payer: Self-pay

## 2016-09-09 ENCOUNTER — Emergency Department (HOSPITAL_COMMUNITY)
Admission: EM | Admit: 2016-09-09 | Discharge: 2016-09-09 | Disposition: A | Discharge status: Home / Self Care | Payer: Self-pay | Attending: Emergency Medicine | Admitting: Emergency Medicine

## 2016-09-09 DIAGNOSIS — Z87891 Personal history of nicotine dependence: Secondary | ICD-10-CM | POA: Insufficient documentation

## 2016-09-09 DIAGNOSIS — W25XXXA Contact with sharp glass, initial encounter: Secondary | ICD-10-CM | POA: Insufficient documentation

## 2016-09-09 DIAGNOSIS — J45909 Unspecified asthma, uncomplicated: Secondary | ICD-10-CM | POA: Insufficient documentation

## 2016-09-09 DIAGNOSIS — Y939 Activity, unspecified: Secondary | ICD-10-CM | POA: Insufficient documentation

## 2016-09-09 DIAGNOSIS — Y929 Unspecified place or not applicable: Secondary | ICD-10-CM | POA: Insufficient documentation

## 2016-09-09 DIAGNOSIS — Z79899 Other long term (current) drug therapy: Secondary | ICD-10-CM | POA: Insufficient documentation

## 2016-09-09 DIAGNOSIS — S81811A Laceration without foreign body, right lower leg, initial encounter: Secondary | ICD-10-CM | POA: Insufficient documentation

## 2016-09-09 DIAGNOSIS — Y999 Unspecified external cause status: Secondary | ICD-10-CM | POA: Insufficient documentation

## 2016-09-09 MED ORDER — CEPHALEXIN 500 MG PO CAPS: 500.0000 mg | ORAL_CAPSULE | Freq: Four times a day (QID) | ORAL | 0 refills | Status: DC

## 2016-09-09 MED ORDER — LIDOCAINE HCL (PF) 1 % IJ SOLN
5.0000 mL | Freq: Once | INTRAMUSCULAR | Status: AC
  Administered 2016-09-09: 5 mL
  Filled 2016-09-09: qty 30

## 2016-09-09 MED ORDER — IBUPROFEN 200 MG PO TABS
600.0000 mg | ORAL_TABLET | Freq: Once | ORAL | Status: AC
  Administered 2016-09-09: 600 mg via ORAL
  Filled 2016-09-09: qty 3

## 2016-09-09 NOTE — Discharge Instructions (Signed)
Follow up in one week for staple removal and wound check. Follow up sooner for any signs of infection.

## 2016-09-09 NOTE — ED Triage Notes (Signed)
Per pt, states right leg laceration to back of calf-cut on glass

## 2016-09-09 NOTE — ED Provider Notes (Signed)
[facility]-EMERGENCY DEPT Provider Note   CSN: [id number] Arrival date & time: 09/09/16  1607     History   Chief Complaint Chief Complaint  Patient presents with  . right leg laceration    HPI [name] [name] is a 26 y.o. female who presents to the ED with a laceration to the back of her right lower leg. The injury happened last night when she hit her leg on a broken glass table. Patient reports that she is unsure if there is still glass in the wound. Patient is up to date on tetanus. Patient reports that she cleaned the area with peroxide last night and put a bandage over it because she was to busy to come to the ED. Today she realized how gaping it was and decided she needed to come in. UTD on tetanus  The history is provided by the patient. No language interpreter was used.  Laceration   The incident occurred yesterday. The laceration is located on the right leg. The laceration is 5 cm in size. The laceration mechanism was a broken glass. The pain is mild. It is unknown if a foreign body is present. Her tetanus status is UTD.    Past Medical History:  Diagnosis Date  . Asthma   . Bacterial infection   . Bipolar disease during pregnancy (HCC)   . Frequent headaches    occ migraines  . Renal stones   . Renal stones   . Umbilical hernia     Patient Active Problem List   Diagnosis Date Noted  . Short interval between pregnancies complicating pregnancy, antepartum 03/13/2014  . Hematuria 03/02/2014  . Acute sinusitis 03/30/2013  . Elevated blood pressure 03/30/2013    Past Surgical History:  Procedure Laterality Date  . DILATION AND EVACUATION    . HERNIA REPAIR    . tubes in ears  09/2012  . WISDOM TOOTH EXTRACTION      OB History    Gravida Para Term Preterm AB Living   3 3 3     2    SAB TAB Ectopic Multiple Live Births         0 2       Home Medications    Prior to Admission medications   Medication Sig Start Date End Date Taking? Authorizing Provider    albuterol (PROVENTIL HFA;VENTOLIN HFA) 108 (90 BASE) MCG/ACT inhaler Inhale 2 puffs into the lungs every 6 (six) hours as needed for wheezing or shortness of breath.    [provider]  calcium carbonate (TUMS - DOSED IN MG ELEMENTAL CALCIUM) 500 MG chewable tablet Chew 1 tablet by mouth as needed.     [provider]  cephALEXin (KEFLEX) 500 MG capsule Take 1 capsule (500 mg total) by mouth 4 (four) times daily. 09/09/16   Janne Napoleon, NP  ibuprofen (ADVIL,MOTRIN) 600 MG tablet Take 1 tablet (600 mg total) by mouth every 6 (six) hours as needed for mild pain or moderate pain. Patient taking differently: Take 600 mg by mouth as needed for mild pain or moderate pain.  08/14/14   Trixie Dredge, PA-C    Family History Family History  Problem Relation Age of Onset  . Heart disease Paternal Grandfather   . Heart disease Paternal Grandmother   . Heart disease Maternal Grandmother   . Diabetes Maternal Grandmother   . Heart disease Maternal Grandfather   . Diabetes Maternal Grandfather   . Other Sister        congenital heart  defect; open heart surgery @ 27 year old  . Hypertension Mother   . Asthma Son   . Asthma Son   . Diabetes Maternal Uncle     Social History Social History  Substance Use Topics  . Smoking status: Former Smoker    Types: Cigarettes  . Smokeless tobacco: Never Used     Comment: 12/2012  . Alcohol use No     Allergies   Codeine and Hydrocodone   Review of Systems Review of Systems  Musculoskeletal: Positive for arthralgias.       Right leg  Skin: Positive for wound.  All other systems reviewed and are negative.    Physical Exam Updated Vital Signs BP (!) 147/87 (BP Location: Left Arm)   Pulse 84   Temp 99.1 F (37.3 C) (Oral)   Resp 16   Wt 86.3 kg (190 lb 3 oz)   LMP 09/09/2016   SpO2 98%   BMI 31.65 kg/m   Physical Exam  Constitutional: She is oriented to person, place, and time. She appears well-developed and well-nourished.  No distress.  HENT:  Head: Normocephalic.  Eyes: EOM are normal.  Neck: Neck supple.  Cardiovascular: Normal rate.   Pulmonary/Chest: Effort normal.  Abdominal: Soft. There is no tenderness.  Musculoskeletal: Normal range of motion.       Legs: 5 cm laceration to the right calf that is gaping  Neurological: She is alert and oriented to person, place, and time. No cranial nerve deficit.  Skin: Skin is warm and dry.  Psychiatric: She has a normal mood and affect. Her behavior is normal.  Nursing note and vitals reviewed.    ED Treatments / Results  Labs (all labs ordered are listed, but only abnormal results are displayed) Labs Reviewed - No data to display   Radiology Dg Tibia/fibula Right  Result Date: 09/09/2016 CLINICAL DATA:  RIGHT leg laceration on glass table. EXAM: RIGHT TIBIA AND FIBULA - 2 VIEW COMPARISON:  None. FINDINGS: There is no evidence of fracture or other focal bone lesions. Soft tissues are unremarkable. IMPRESSION: Negative. Electronically Signed   By: Awilda Metro M.D.   On: 09/09/2016 20:47    Procedures .Marland KitchenLaceration Repair Date/Time: 09/09/2016 9:55 PM Performed by: Janne Napoleon Authorized by: Janne Napoleon   Consent:    Consent obtained:  Verbal   Consent given by:  Patient   Risks discussed:  Infection, pain and poor cosmetic result   Alternatives discussed:  No treatment Anesthesia (see MAR for exact dosages):    Anesthesia method:  Local infiltration   Local anesthetic:  Lidocaine 1% w/o epi Laceration details:    Location:  Leg   Leg location:  R lower leg   Length (cm):  5 Repair type:    Repair type:  Simple Pre-procedure details:    Preparation:  Patient was prepped and draped in usual sterile fashion and imaging obtained to evaluate for foreign bodies Exploration:    Hemostasis achieved with:  Direct pressure   Wound exploration: entire depth of wound probed and visualized     Wound extent: no tendon damage noted and no  underlying fracture noted   Treatment:    Area cleansed with:  Betadine and saline   Amount of cleaning:  Standard   Irrigation solution:  Sterile saline   Irrigation method:  Syringe Skin repair:    Repair method:  Staples   Number of staples:  5 Approximation:    Approximation:  Loose Post-procedure details:  Dressing:  Antibiotic ointment and sterile dressing   Patient tolerance of procedure:  Tolerated well, no immediate complications   (including critical care time)  Medications Ordered in ED Medications  ibuprofen (ADVIL,MOTRIN) tablet 600 mg (600 mg Oral Given 09/09/16 2130)  lidocaine (PF) (XYLOCAINE) 1 % injection 5 mL (5 mLs Infiltration Given 09/09/16 2136)     Initial Impression / Assessment and Plan / ED Course  I have reviewed the triage vital signs and the nursing notes. 26 y.o. female with laceration to the right lower leg stable for d/c without foreign body or fracture noted on x-ray or clinical exam. although the wound was over 12 hours old it was irrigated and loosely closed with staples due to the gaping wound. Discussed with patient close f/u and return for signs of infection.   Final Clinical Impressions(s) / ED Diagnoses   Final diagnoses:  Laceration of leg, right, initial encounter    New Prescriptions New Prescriptions   CEPHALEXIN (KEFLEX) 500 MG CAPSULE    Take 1 capsule (500 mg total) by mouth 4 (four) times daily.     Kerrie Buffaloeese, Hope PortageM, [place]NP 09/09/16 2202    Gwyneth SproutPlunkett, [place], MD 09/14/16 [phone]

## 2021-04-29 ENCOUNTER — Encounter: Payer: BC Managed Care – PPO | Admitting: Obstetrics & Gynecology

## 2023-03-19 ENCOUNTER — Ambulatory Visit (INDEPENDENT_AMBULATORY_CARE_PROVIDER_SITE_OTHER): Payer: Self-pay | Admitting: Obstetrics & Gynecology

## 2023-03-19 ENCOUNTER — Encounter: Payer: Self-pay | Admitting: Obstetrics & Gynecology

## 2023-03-19 ENCOUNTER — Other Ambulatory Visit (HOSPITAL_COMMUNITY)
Admission: RE | Admit: 2023-03-19 | Discharge: 2023-03-19 | Disposition: A | Discharge status: Home / Self Care | Source: Ambulatory Visit | Attending: Obstetrics & Gynecology | Admitting: Obstetrics & Gynecology

## 2023-03-19 VITALS — BP 135/82 | HR 86 | Ht 66.0 in | Wt 224.0 lb

## 2023-03-19 DIAGNOSIS — Z3202 Encounter for pregnancy test, result negative: Secondary | ICD-10-CM

## 2023-03-19 DIAGNOSIS — Z01419 Encounter for gynecological examination (general) (routine) without abnormal findings: Secondary | ICD-10-CM | POA: Insufficient documentation

## 2023-03-19 DIAGNOSIS — Z30433 Encounter for removal and reinsertion of intrauterine contraceptive device: Secondary | ICD-10-CM | POA: Diagnosis not present

## 2023-03-19 LAB — POCT URINE PREGNANCY: Preg Test, Ur: NEGATIVE

## 2023-03-19 MED ORDER — LEVONORGESTREL 20 MCG/DAY IU IUD
1.0000 | INTRAUTERINE_SYSTEM | Freq: Once | INTRAUTERINE | Status: AC
  Administered 2023-03-19: 1 via INTRAUTERINE

## 2023-03-19 MED ORDER — SILVER SULFADIAZINE 1 % EX CREA: TOPICAL_CREAM | CUTANEOUS | 11 refills | Status: AC

## 2023-03-19 NOTE — Addendum Note (Signed)
 Addended by: Annamarie Dawley on: 03/19/2023 04:13 PM   Modules accepted: Orders

## 2023-03-19 NOTE — Progress Notes (Signed)
 Subjective:     [name] [name] is a 33 y.o. female here for a routine exam.  Patient's last menstrual period was 03/06/2023. [id number] Birth Control Method:  Mirena IUD Menstrual Calendar(currently): spotting 2 weeks  Current complaints: wants IUD changed out.   Current acute medical issues:  HS   Recent Gynecologic History Patient's last menstrual period was 03/06/2023. Last Pap: 2016,  normal Last mammogram: ,    Past Medical History:  Diagnosis Date   Asthma    Bacterial infection    Bipolar disease during pregnancy (HCC)    Frequent headaches    occ migraines   Renal stones    Renal stones    Umbilical hernia     Past Surgical History:  Procedure Laterality Date   DILATION AND EVACUATION     HERNIA REPAIR     tubes in ears  09/2012   WISDOM TOOTH EXTRACTION      OB History     Gravida  3   Para  3   Term  3   Preterm      AB      Living  2      SAB      IAB      Ectopic      Multiple  0   Live Births  2           Social History   Socioeconomic History   Marital status: Single    Spouse name: Not on file   Number of children: Not on file   Years of education: Not on file   Highest education level: Not on file  Occupational History   Not on file  Tobacco Use   Smoking status: Former    Types: Cigarettes   Smokeless tobacco: Never   Tobacco comments:    12/2012  Substance and Sexual Activity   Alcohol use: No   Drug use: No   Sexual activity: Yes    Birth control/protection: None, Condom  Other Topics Concern   Not on file  Social History Narrative   Not on file   Social Drivers of Health   Financial Resource Strain: Not on file  Food Insecurity: No Food Insecurity (05/08/2021)   [facility]   Hunger Vital Sign    Worried About Running Out of Food in the Last Year: Never true    Ran Out of Food in the Last Year: Never true  Transportation Needs: Not on file  Physical Activity: Not on file  Stress: Not  on file  Social Connections: Unknown (05/05/2021)   Received from [facility]   Social Network    Social Network: Not on file    Family History  Problem Relation Age of Onset   Heart disease Paternal Grandfather    Heart disease Paternal Grandmother    Heart disease Maternal Grandmother    Diabetes Maternal Grandmother    Heart disease Maternal Grandfather    Diabetes Maternal Grandfather    Other Sister        congenital heart defect; open heart surgery @ 33 year old   Hypertension Mother    Asthma Son    Asthma Son    Diabetes Maternal Uncle      Current Outpatient Medications:    albuterol (PROVENTIL HFA;VENTOLIN HFA) 108 (90 BASE) MCG/ACT inhaler, Inhale 2 puffs into the lungs every 6 (six) hours as needed for wheezing or shortness of breath. (Patient not taking: Reported on 03/19/2023), Disp: ,  Rfl:    calcium carbonate (TUMS - DOSED IN MG ELEMENTAL CALCIUM) 500 MG chewable tablet, Chew 1 tablet by mouth as needed.  (Patient not taking: Reported on 03/19/2023), Disp: , Rfl:   Review of Systems  Review of Systems  Constitutional: Negative for fever, chills, weight loss, malaise/fatigue and diaphoresis.  HENT: Negative for hearing loss, ear pain, nosebleeds, congestion, sore throat, neck pain, tinnitus and ear discharge.   Eyes: Negative for blurred vision, double vision, photophobia, pain, discharge and redness.  Respiratory: Negative for cough, hemoptysis, sputum production, shortness of breath, wheezing and stridor.   Cardiovascular: Negative for chest pain, palpitations, orthopnea, claudication, leg swelling and PND.  Gastrointestinal: negative for abdominal pain. Negative for heartburn, nausea, vomiting, diarrhea, constipation, blood in stool and melena.  Genitourinary: Negative for dysuria, urgency, frequency, hematuria and flank pain.  Musculoskeletal: Negative for myalgias, back pain, joint pain and falls.  Skin: Negative for itching and rash.  Neurological: Negative  for dizziness, tingling, tremors, sensory change, speech change, focal weakness, seizures, loss of consciousness, weakness and headaches.  Endo/Heme/Allergies: Negative for environmental allergies and polydipsia. Does not bruise/bleed easily.  Psychiatric/Behavioral: Negative for depression, suicidal ideas, hallucinations, memory loss and substance abuse. The patient is not nervous/anxious and does not have insomnia.        Objective:  Blood pressure 135/82, pulse 86, height 5\' 6"  (1.676 m), weight 224 lb (101.6 kg), last menstrual period 03/06/2023.   Physical Exam  Vitals reviewed. Constitutional: She is oriented to person, place, and time. She appears well-developed and well-nourished.  HENT:  Head: Normocephalic and atraumatic.        Right Ear: External ear normal.  Left Ear: External ear normal.  Nose: Nose normal.  Mouth/Throat: Oropharynx is clear and moist.  Eyes: Conjunctivae and EOM are normal. Pupils are equal, round, and reactive to light. Right eye exhibits no discharge. Left eye exhibits no discharge. No scleral icterus.  Neck: Normal range of motion. Neck supple. No tracheal deviation present. No thyromegaly present.  Cardiovascular: Normal rate, regular rhythm, normal heart sounds and intact distal pulses.  Exam reveals no gallop and no friction rub.   No murmur heard. Respiratory: Effort normal and breath sounds normal. No respiratory distress. She has no wheezes. She has no rales. She exhibits no tenderness.  GI: Soft. Bowel sounds are normal. She exhibits no distension and no mass. There is no tenderness. There is no rebound and no guarding.  Genitourinary:  Breasts no masses skin changes or nipple changes bilaterally      Vulva is normal without lesions Vagina is pink moist without discharge Cervix normal in appearance and pap is done Uterus is normal size shape and contour Adnexa is negative with normal sized ovaries   Musculoskeletal: Normal range of motion. She  exhibits no edema and no tenderness.  Neurological: She is alert and oriented to person, place, and time. She has normal reflexes. She displays normal reflexes. No cranial nerve deficit. She exhibits normal muscle tone. Coordination normal.  Skin: Skin is warm and dry. No rash noted. No erythema. No pallor.  Psychiatric: She has a normal mood and affect. Her behavior is normal. Judgment and thought content normal.   Mirena IUD strings not seen Used long curved kelley and removed with 1 try without difficulty  IUD Insertion Procedure Note  Pre-operative Diagnosis: multiparous femaled desires reinsertion of IUD for birth control and cycle control  Post-operative Diagnosis: same  Indications: contraception  Procedure Details  Urine pregnancy test was  not done.  The risks (including infection, bleeding, pain, and uterine perforation) and benefits of the procedure were explained to the patient and Written informed consent was obtained.    Cervix cleansed with Betadine. Uterus sounded to 8 cm. IUD inserted without difficulty. String visible and trimmed. Patient tolerated procedure well.  IUD Information: Mirena, Lot # [id number], Expiration date Feb 2027.  Condition: Stable  Complications: None  Plan:  The patient was advised to call for any fever or for prolonged or severe pain or bleeding. She was advised to use OTC ibuprofen as needed for mild to moderate pain.   Attending Physician Documentation: I performed removal and reinsertion     Medications Ordered at today's visit: No orders of the defined types were placed in this encounter.   Other orders placed at today's visit: Orders Placed This Encounter  Procedures   POCT urine pregnancy     ASSESSMENT + PLAN:    ICD-10-CM   1. Well woman exam with routine gynecological exam  Z01.419     2. Encounter for gynecological examination with Papanicolaou smear of cervix  Z01.419 Cytology - PAP( [facility])    3. Pregnancy  test negative  Z32.02 POCT urine pregnancy    4. Encounter for IUD removal and reinsertion  Z30.433           No follow-ups on file.

## 2023-03-24 LAB — CYTOLOGY - PAP
Chlamydia: NEGATIVE
Comment: NEGATIVE
Comment: NEGATIVE
Comment: NORMAL
Diagnosis: UNDETERMINED — AB
High risk HPV: NEGATIVE
Neisseria Gonorrhea: NEGATIVE

## 2023-04-01 ENCOUNTER — Telehealth: Payer: Self-pay

## 2023-04-01 NOTE — Telephone Encounter (Signed)
 Patient called and stated that she got an IUD a few weeks ago and she is still bleeding; wants to know if this is normal.  Would like to speak with a nurse.

## 2023-04-02 NOTE — Telephone Encounter (Signed)
 Returned patient's call.  States she has been bleeding since the IUD was placed on 3/13. No soaking pads but just been a "steady flow".  Previous IUD was expired and had recently been on her period for 2 weeks.  Informed patient irregular bleeding can occur after having IUD placed and to give it a little more time for her body to adjust.  If bleeding does not start to slow down in the next few weeks, to let us know.  Verbalized understanding.

## 2023-04-09 ENCOUNTER — Encounter: Payer: Self-pay | Admitting: Obstetrics & Gynecology

## 2023-04-13 MED ORDER — MEGESTROL ACETATE 40 MG PO TABS: ORAL_TABLET | ORAL | 0 refills | Status: AC
